# Patient Record
Sex: Female | Born: 1940 | Hispanic: No | Marital: Single | Smoking: Former smoker
Health system: Southern US, Community
[De-identification: ages and names within clinical notes are randomized; demographics above are authoritative.]

## PROBLEM LIST (undated history)

## (undated) DIAGNOSIS — I509 Heart failure, unspecified: Secondary | ICD-10-CM

## (undated) DIAGNOSIS — I10 Essential (primary) hypertension: Secondary | ICD-10-CM

## (undated) DIAGNOSIS — E119 Type 2 diabetes mellitus without complications: Secondary | ICD-10-CM

## (undated) DIAGNOSIS — E782 Mixed hyperlipidemia: Secondary | ICD-10-CM

## (undated) DIAGNOSIS — J449 Chronic obstructive pulmonary disease, unspecified: Secondary | ICD-10-CM

## (undated) DIAGNOSIS — J45909 Unspecified asthma, uncomplicated: Secondary | ICD-10-CM

## (undated) HISTORY — PX: CATARACT EXTRACTION: SUR2

## (undated) HISTORY — DX: Chronic obstructive pulmonary disease, unspecified: J44.9

## (undated) HISTORY — PX: OTHER SURGICAL HISTORY: SHX169

## (undated) HISTORY — DX: Mixed hyperlipidemia: E78.2

---

## 2015-05-18 DIAGNOSIS — J449 Chronic obstructive pulmonary disease, unspecified: Secondary | ICD-10-CM

## 2015-05-18 HISTORY — DX: Chronic obstructive pulmonary disease, unspecified: J44.9

## 2015-12-02 ENCOUNTER — Other Ambulatory Visit (HOSPITAL_COMMUNITY): Payer: Self-pay | Admitting: Emergency Medicine

## 2015-12-02 ENCOUNTER — Ambulatory Visit (HOSPITAL_COMMUNITY)
Admission: RE | Admit: 2015-12-02 | Discharge: 2015-12-02 | Disposition: A | Discharge status: Home / Self Care | Payer: Self-pay | Source: Ambulatory Visit | Attending: Emergency Medicine | Admitting: Emergency Medicine

## 2015-12-02 DIAGNOSIS — M79605 Pain in left leg: Secondary | ICD-10-CM

## 2015-12-02 DIAGNOSIS — R609 Edema, unspecified: Secondary | ICD-10-CM

## 2015-12-03 ENCOUNTER — Ambulatory Visit (HOSPITAL_COMMUNITY)
Admission: RE | Admit: 2015-12-03 | Discharge: 2015-12-03 | Disposition: A | Discharge status: Home / Self Care | Payer: Medicare Other | Source: Ambulatory Visit | Attending: Emergency Medicine | Admitting: Emergency Medicine

## 2015-12-03 DIAGNOSIS — R609 Edema, unspecified: Secondary | ICD-10-CM | POA: Insufficient documentation

## 2015-12-03 DIAGNOSIS — M79605 Pain in left leg: Secondary | ICD-10-CM | POA: Diagnosis not present

## 2015-12-20 ENCOUNTER — Inpatient Hospital Stay
Admission: EM | Admit: 2015-12-20 | Discharge: 2015-12-24 | DRG: 602 | Disposition: A | Discharge status: Home / Self Care | Payer: Medicare Other | Attending: Internal Medicine | Admitting: Internal Medicine

## 2015-12-20 ENCOUNTER — Encounter: Payer: Self-pay | Admitting: Emergency Medicine

## 2015-12-20 DIAGNOSIS — N170 Acute kidney failure with tubular necrosis: Secondary | ICD-10-CM | POA: Diagnosis present

## 2015-12-20 DIAGNOSIS — Z79899 Other long term (current) drug therapy: Secondary | ICD-10-CM | POA: Diagnosis not present

## 2015-12-20 DIAGNOSIS — E1140 Type 2 diabetes mellitus with diabetic neuropathy, unspecified: Secondary | ICD-10-CM | POA: Diagnosis present

## 2015-12-20 DIAGNOSIS — Z87891 Personal history of nicotine dependence: Secondary | ICD-10-CM

## 2015-12-20 DIAGNOSIS — L03119 Cellulitis of unspecified part of limb: Secondary | ICD-10-CM

## 2015-12-20 DIAGNOSIS — E785 Hyperlipidemia, unspecified: Secondary | ICD-10-CM | POA: Diagnosis present

## 2015-12-20 DIAGNOSIS — K219 Gastro-esophageal reflux disease without esophagitis: Secondary | ICD-10-CM | POA: Diagnosis present

## 2015-12-20 DIAGNOSIS — I2510 Atherosclerotic heart disease of native coronary artery without angina pectoris: Secondary | ICD-10-CM | POA: Diagnosis present

## 2015-12-20 DIAGNOSIS — R809 Proteinuria, unspecified: Secondary | ICD-10-CM | POA: Diagnosis present

## 2015-12-20 DIAGNOSIS — N179 Acute kidney failure, unspecified: Secondary | ICD-10-CM | POA: Diagnosis present

## 2015-12-20 DIAGNOSIS — I130 Hypertensive heart and chronic kidney disease with heart failure and stage 1 through stage 4 chronic kidney disease, or unspecified chronic kidney disease: Secondary | ICD-10-CM | POA: Diagnosis present

## 2015-12-20 DIAGNOSIS — E1122 Type 2 diabetes mellitus with diabetic chronic kidney disease: Secondary | ICD-10-CM | POA: Diagnosis present

## 2015-12-20 DIAGNOSIS — N183 Chronic kidney disease, stage 3 (moderate): Secondary | ICD-10-CM | POA: Diagnosis present

## 2015-12-20 DIAGNOSIS — J45909 Unspecified asthma, uncomplicated: Secondary | ICD-10-CM | POA: Diagnosis present

## 2015-12-20 DIAGNOSIS — Z7984 Long term (current) use of oral hypoglycemic drugs: Secondary | ICD-10-CM

## 2015-12-20 DIAGNOSIS — I5022 Chronic systolic (congestive) heart failure: Secondary | ICD-10-CM | POA: Diagnosis present

## 2015-12-20 DIAGNOSIS — L03116 Cellulitis of left lower limb: Secondary | ICD-10-CM | POA: Diagnosis present

## 2015-12-20 DIAGNOSIS — J449 Chronic obstructive pulmonary disease, unspecified: Secondary | ICD-10-CM | POA: Diagnosis present

## 2015-12-20 HISTORY — DX: Type 2 diabetes mellitus without complications: E11.9

## 2015-12-20 HISTORY — DX: Essential (primary) hypertension: I10

## 2015-12-20 HISTORY — DX: Heart failure, unspecified: I50.9

## 2015-12-20 HISTORY — DX: Unspecified asthma, uncomplicated: J45.909

## 2015-12-20 LAB — CBC WITH DIFFERENTIAL/PLATELET
BASOS ABS: 0 10*3/uL (ref 0–0.1)
BASOS PCT: 1 %
Eosinophils Absolute: 0.2 10*3/uL (ref 0–0.7)
Eosinophils Relative: 4 %
HEMATOCRIT: 32.7 % — AB (ref 35.0–47.0)
HEMOGLOBIN: 10.1 g/dL — AB (ref 12.0–16.0)
Lymphocytes Relative: 15 %
Lymphs Abs: 0.9 10*3/uL — ABNORMAL LOW (ref 1.0–3.6)
MCH: 24.6 pg — ABNORMAL LOW (ref 26.0–34.0)
MCHC: 30.8 g/dL — ABNORMAL LOW (ref 32.0–36.0)
MCV: 79.9 fL — ABNORMAL LOW (ref 80.0–100.0)
MONOS PCT: 7 %
Monocytes Absolute: 0.4 10*3/uL (ref 0.2–0.9)
NEUTROS ABS: 4.9 10*3/uL (ref 1.4–6.5)
NEUTROS PCT: 75 %
Platelets: 176 10*3/uL (ref 150–440)
RBC: 4.09 MIL/uL (ref 3.80–5.20)
RDW: 15.9 % — ABNORMAL HIGH (ref 11.5–14.5)
WBC: 6.5 10*3/uL (ref 3.6–11.0)

## 2015-12-20 LAB — COMPREHENSIVE METABOLIC PANEL
ALBUMIN: 4.1 g/dL (ref 3.5–5.0)
ALK PHOS: 84 U/L (ref 38–126)
ALT: 34 U/L (ref 14–54)
AST: 45 U/L — AB (ref 15–41)
Anion gap: 10 (ref 5–15)
BILIRUBIN TOTAL: 0.5 mg/dL (ref 0.3–1.2)
BUN: 48 mg/dL — AB (ref 6–20)
CO2: 26 mmol/L (ref 22–32)
Calcium: 9 mg/dL (ref 8.9–10.3)
Chloride: 100 mmol/L — ABNORMAL LOW (ref 101–111)
Creatinine, Ser: 3.42 mg/dL — ABNORMAL HIGH (ref 0.44–1.00)
GFR calc Af Amer: 14 mL/min — ABNORMAL LOW (ref >60)
GFR calc non Af Amer: 12 mL/min — ABNORMAL LOW (ref >60)
GLUCOSE: 93 mg/dL (ref 65–99)
Potassium: 4.7 mmol/L (ref 3.5–5.1)
Sodium: 136 mmol/L (ref 135–145)
TOTAL PROTEIN: 7.7 g/dL (ref 6.5–8.1)

## 2015-12-20 LAB — LACTIC ACID, PLASMA
LACTIC ACID, VENOUS: 2.4 mmol/L — AB (ref 0.5–2.0)
Lactic Acid, Venous: 1 mmol/L (ref 0.5–2.0)

## 2015-12-20 MED ORDER — ENOXAPARIN SODIUM 30 MG/0.3ML ~~LOC~~ SOLN
30.0000 mg | SUBCUTANEOUS | Status: DC
  Administered 2015-12-20 – 2015-12-21 (×2): 30 mg via SUBCUTANEOUS
  Filled 2015-12-20 (×2): qty 0.3

## 2015-12-20 MED ORDER — SODIUM CHLORIDE 0.9 % IV SOLN
INTRAVENOUS | Status: DC
  Administered 2015-12-20: 21:00:00 via INTRAVENOUS

## 2015-12-20 MED ORDER — DOCUSATE SODIUM 100 MG PO CAPS
100.0000 mg | ORAL_CAPSULE | Freq: Two times a day (BID) | ORAL | Status: DC
  Administered 2015-12-20 – 2015-12-24 (×5): 100 mg via ORAL
  Filled 2015-12-20 (×8): qty 1

## 2015-12-20 MED ORDER — IPRATROPIUM BROMIDE HFA 17 MCG/ACT IN AERS: 2.0000 | INHALATION_SPRAY | Freq: Two times a day (BID) | RESPIRATORY_TRACT | Status: DC

## 2015-12-20 MED ORDER — ONDANSETRON HCL 4 MG PO TABS: 4.0000 mg | ORAL_TABLET | Freq: Four times a day (QID) | ORAL | Status: DC | PRN

## 2015-12-20 MED ORDER — VANCOMYCIN HCL 500 MG IV SOLR
500.0000 mg | Freq: Once | INTRAVENOUS | Status: DC
  Filled 2015-12-20: qty 500

## 2015-12-20 MED ORDER — ASPIRIN EC 81 MG PO TBEC
81.0000 mg | DELAYED_RELEASE_TABLET | Freq: Every day | ORAL | Status: DC
  Administered 2015-12-21 – 2015-12-24 (×4): 81 mg via ORAL
  Filled 2015-12-20 (×4): qty 1

## 2015-12-20 MED ORDER — ACETAMINOPHEN 650 MG RE SUPP: 650.0000 mg | Freq: Four times a day (QID) | RECTAL | Status: DC | PRN

## 2015-12-20 MED ORDER — SODIUM CHLORIDE 0.9 % IV SOLN: INTRAVENOUS | Status: DC

## 2015-12-20 MED ORDER — VITAMIN D 1000 UNITS PO TABS
1000.0000 [IU] | ORAL_TABLET | Freq: Every day | ORAL | Status: DC
  Administered 2015-12-21 – 2015-12-24 (×4): 1000 [IU] via ORAL
  Filled 2015-12-20 (×4): qty 1

## 2015-12-20 MED ORDER — MOMETASONE FURO-FORMOTEROL FUM 200-5 MCG/ACT IN AERO
2.0000 | INHALATION_SPRAY | Freq: Two times a day (BID) | RESPIRATORY_TRACT | Status: DC
  Administered 2015-12-21 – 2015-12-24 (×7): 2 via RESPIRATORY_TRACT
  Filled 2015-12-20: qty 8.8

## 2015-12-20 MED ORDER — SODIUM CHLORIDE 0.9 % IV BOLUS (SEPSIS): 1000.0000 mL | Freq: Once | INTRAVENOUS | Status: DC

## 2015-12-20 MED ORDER — SODIUM CHLORIDE 0.9 % IV BOLUS (SEPSIS)
1000.0000 mL | Freq: Once | INTRAVENOUS | Status: AC
  Administered 2015-12-20: 1000 mL via INTRAVENOUS

## 2015-12-20 MED ORDER — MONTELUKAST SODIUM 10 MG PO TABS
10.0000 mg | ORAL_TABLET | Freq: Every day | ORAL | Status: DC
  Administered 2015-12-20 – 2015-12-23 (×4): 10 mg via ORAL
  Filled 2015-12-20 (×4): qty 1

## 2015-12-20 MED ORDER — ONDANSETRON HCL 4 MG/2ML IJ SOLN: 4.0000 mg | Freq: Four times a day (QID) | INTRAMUSCULAR | Status: DC | PRN

## 2015-12-20 MED ORDER — IPRATROPIUM BROMIDE 0.02 % IN SOLN
0.5000 mg | Freq: Two times a day (BID) | RESPIRATORY_TRACT | Status: DC
  Administered 2015-12-20 – 2015-12-22 (×5): 0.5 mg via RESPIRATORY_TRACT
  Filled 2015-12-20 (×5): qty 2.5

## 2015-12-20 MED ORDER — SODIUM CHLORIDE 0.9 % IV BOLUS (SEPSIS): 500.0000 mL | Freq: Once | INTRAVENOUS | Status: DC

## 2015-12-20 MED ORDER — CARVEDILOL 12.5 MG PO TABS
12.5000 mg | ORAL_TABLET | Freq: Two times a day (BID) | ORAL | Status: DC
  Administered 2015-12-21 – 2015-12-24 (×6): 12.5 mg via ORAL
  Filled 2015-12-20 (×7): qty 1

## 2015-12-20 MED ORDER — GABAPENTIN 300 MG PO CAPS
300.0000 mg | ORAL_CAPSULE | Freq: Three times a day (TID) | ORAL | Status: DC
  Administered 2015-12-20 – 2015-12-24 (×11): 300 mg via ORAL
  Filled 2015-12-20 (×12): qty 1

## 2015-12-20 MED ORDER — MELATONIN 3 MG PO TABS: 3.0000 mg | ORAL_TABLET | Freq: Every day | ORAL | Status: DC

## 2015-12-20 MED ORDER — FLUTICASONE PROPIONATE 50 MCG/ACT NA SUSP: 1.0000 | Freq: Every day | NASAL | Status: DC | PRN

## 2015-12-20 MED ORDER — GLIPIZIDE 5 MG PO TABS: 5.0000 mg | ORAL_TABLET | Freq: Two times a day (BID) | ORAL | Status: DC

## 2015-12-20 MED ORDER — PIPERACILLIN-TAZOBACTAM 3.375 G IVPB 30 MIN
3.3750 g | Freq: Once | INTRAVENOUS | Status: AC
  Administered 2015-12-20: 3.375 g via INTRAVENOUS
  Filled 2015-12-20: qty 50

## 2015-12-20 MED ORDER — PRAVASTATIN SODIUM 10 MG PO TABS
10.0000 mg | ORAL_TABLET | Freq: Every day | ORAL | Status: DC
  Administered 2015-12-21 – 2015-12-23 (×3): 10 mg via ORAL
  Filled 2015-12-20 (×4): qty 1

## 2015-12-20 MED ORDER — FERROUS SULFATE 325 (65 FE) MG PO TABS
325.0000 mg | ORAL_TABLET | Freq: Every day | ORAL | Status: DC
  Administered 2015-12-21 – 2015-12-24 (×4): 325 mg via ORAL
  Filled 2015-12-20 (×4): qty 1

## 2015-12-20 MED ORDER — PIPERACILLIN-TAZOBACTAM 3.375 G IVPB
3.3750 g | Freq: Two times a day (BID) | INTRAVENOUS | Status: DC
  Administered 2015-12-21: 3.375 g via INTRAVENOUS
  Filled 2015-12-20 (×2): qty 50

## 2015-12-20 MED ORDER — PIPERACILLIN-TAZOBACTAM 3.375 G IVPB 30 MIN
3.3750 g | Freq: Three times a day (TID) | INTRAVENOUS | Status: DC
  Filled 2015-12-20 (×2): qty 50

## 2015-12-20 MED ORDER — ACETAMINOPHEN 325 MG PO TABS
650.0000 mg | ORAL_TABLET | Freq: Four times a day (QID) | ORAL | Status: DC | PRN
Start: 2015-12-20 — End: 2015-12-24
  Administered 2015-12-20 – 2015-12-24 (×6): 650 mg via ORAL
  Filled 2015-12-20 (×6): qty 2

## 2015-12-20 MED ORDER — TRAMADOL HCL 50 MG PO TABS
50.0000 mg | ORAL_TABLET | Freq: Four times a day (QID) | ORAL | Status: DC | PRN
  Administered 2015-12-20: 50 mg via ORAL
  Filled 2015-12-20: qty 1

## 2015-12-20 MED ORDER — PANTOPRAZOLE SODIUM 40 MG PO TBEC
40.0000 mg | DELAYED_RELEASE_TABLET | Freq: Every day | ORAL | Status: DC
Start: 2015-12-20 — End: 2015-12-24
  Administered 2015-12-20 – 2015-12-24 (×5): 40 mg via ORAL
  Filled 2015-12-20 (×5): qty 1

## 2015-12-20 MED ORDER — VANCOMYCIN HCL IN DEXTROSE 1-5 GM/200ML-% IV SOLN
1000.0000 mg | Freq: Once | INTRAVENOUS | Status: AC
  Administered 2015-12-20: 1000 mg via INTRAVENOUS
  Filled 2015-12-20: qty 200

## 2015-12-20 NOTE — ED Notes (Signed)
States had an infection in her leg but at the doctor today he told her that she now had infection in blood stream and to go to ER. Having chills x 1 week.

## 2015-12-20 NOTE — Progress Notes (Signed)
Pharmacy Antibiotic Note  Ann KannerGracie Boone is a 75 y.o. female admitted on 12/20/2015 with cellulitis.  Pharmacy has been consulted for vancomycin & piperacillin/tazobactam dosing.  Patient has received cephalexin, clindamycin, and SMX/TMP outpatient prior to admission for cellulitis. Vancomycin and piperacillin/tazobactam being started on admission. Patient also has AKI.  Plan: Piperacillin/tazobactam 3.375 g IV q12h EI  Vancomycin 1500 mg dose tonight (~20 mg/kg adjusted body weight of 77 kg).  Will check vancomycin level at 1700 tomorrow (~24 hour level) and dose off of random level given AKI and estimated CrCl of ~17 mL/min.  Goal vancomycin trough 10-15 mcg/mL  Height: 5\' 6"  (167.6 cm) Weight: 228 lb (103.42 kg) IBW/kg (Calculated) : 59.3  Temp (24hrs), Avg:99.2 F (37.3 C), Min:99.2 F (37.3 C), Max:99.2 F (37.3 C)   Recent Labs Lab 12/20/15 1258 12/20/15 1640  WBC 6.5  --   CREATININE 3.42*  --   LATICACIDVEN  --  1.0    Estimated Creatinine Clearance: 17.8 mL/min (by C-G formula based on Cr of 3.42).    No Known Allergies  Antimicrobials this admission: vancomycin 5/22 >>  Piperacillin/tazobactam 5/22 >>   Dose adjustments this admission:  Microbiology results: 5/22 BCx: Sent 5/22 UCx: Sent   Thank you for allowing pharmacy to be a part of this patient's care.  Cindi CarbonMary M Zophia Marrone, PharmD Clinical Pharmacist 12/20/2015 5:53 PM

## 2015-12-20 NOTE — ED Provider Notes (Signed)
[facility] Emergency Department Provider Note   ____________________________________________  Time seen: Approximately 345 PM  I have reviewed the triage vital signs and the nursing notes.   HISTORY  Chief Complaint Abnormal Lab   HPI Ann Boone is a 75 y.o. female with a history of diabetes and hypertension who is presenting to the emergency department today with left lower extremity swelling and fever. She has had 3 courses of outpatient antibiotics with her primary care doctor without resolution of her cellulitis. She had a wound culture that grew Klebsiella and was found to be febrile to 100.9 earlier today at [facility]. She was being seen by Dr. Samuella CotaPrice. The patient was on Keflex and Bactrim and clindamycin with worsening of her left lower extremity cellulitis. The patient says that she also has had chills over the week. Patient has also had her Lasix doubled for period of time to 40 mg per day. Renal function has been tracked by the primary care doctor and was 1.28 about 2 weeks ago. When the patient follow-up at her primary care doctor's office earlier today she was sent to the emergency department because of her worsening clinical condition and now fever.   Past Medical History  Diagnosis Date  . Diabetes mellitus without complication (HCC)   . Hypertension   . Asthma     There are no active problems to display for this patient.   History reviewed. No pertinent past surgical history.  No current outpatient prescriptions on file.  Allergies Review of patient's allergies indicates no known allergies.  No family history on file.  Social History Social History  Substance Use Topics  . Smoking status: Former Games developermoker  . Smokeless tobacco: None  . Alcohol Use: No    Review of Systems Constitutional: chills Eyes: No visual changes. ENT: No sore throat. Cardiovascular: Denies chest pain. Respiratory: Denies  shortness of breath. Gastrointestinal: No abdominal pain.  No nausea, no vomiting.  No diarrhea.  No constipation. Genitourinary: Negative for dysuria. Musculoskeletal: Negative for back pain. Skin: Negative for rash. Neurological: Negative for headaches, focal weakness or numbness.  10-point ROS otherwise negative.  ____________________________________________   PHYSICAL EXAM:  VITAL SIGNS: ED Triage Vitals  Enc Vitals Group     BP 12/20/15 1248 155/49 mmHg     Pulse Rate 12/20/15 1248 70     Resp 12/20/15 1248 20     Temp 12/20/15 1248 99.2 F (37.3 C)     Temp src --      SpO2 12/20/15 1248 99 %     Weight 12/20/15 1248 226 lb (102.513 kg)     Height 12/20/15 1248 5\' 6"  (1.676 m)     Head Cir --      Peak Flow --      Pain Score 12/20/15 1249 8     Pain Loc --      Pain Edu? --      Excl. in GC? --     Constitutional: Alert and oriented. Well appearing and in no acute distress. Eyes: Conjunctivae are normal. PERRL. EOMI. Head: Atraumatic. Nose: No congestion/rhinnorhea. Mouth/Throat: Mucous membranes are moist.   Neck: No stridor.   Cardiovascular: Normal rate, regular rhythm. Grossly normal heart sounds.  Good peripheral circulation Intact dorsalis pedis pulses to bilateral lower extremities Respiratory: Normal respiratory effort.  No retractions. Lungs CTAB. Gastrointestinal: Soft and nontender. No distention.  Musculoskeletal: Bilateral lower extremity edema with the left being greater than the right. Erythema  to the bilateral lower extremities. On the left the erythema extends from the ankle to just below the knee. On the right it centers around the ankle. Mild tenderness to palpation to left lower extremity.  No induration.   Flaking skin to the left leg as well. Skin:  Skin is warm, dry and intact. No rash noted. Psychiatric: Mood and affect are normal. Speech and behavior are normal.  ____________________________________________   LABS (all labs ordered are  listed, but only abnormal results are displayed)  Labs Reviewed  CBC WITH DIFFERENTIAL/PLATELET - Abnormal; Notable for the following:    Hemoglobin 10.1 (*)    HCT 32.7 (*)    MCV 79.9 (*)    MCH 24.6 (*)    MCHC 30.8 (*)    RDW 15.9 (*)    Lymphs Abs 0.9 (*)    All other components within normal limits  COMPREHENSIVE METABOLIC PANEL - Abnormal; Notable for the following:    Chloride 100 (*)    BUN 48 (*)    Creatinine, Ser 3.42 (*)    AST 45 (*)    GFR calc non Af Amer 12 (*)    GFR calc Af Amer 14 (*)    All other components within normal limits  CULTURE, BLOOD (ROUTINE X 2)  CULTURE, BLOOD (ROUTINE X 2)  URINE CULTURE  URINALYSIS COMPLETEWITH MICROSCOPIC ([facility] ONLY)  LACTIC ACID, PLASMA  LACTIC ACID, PLASMA   ____________________________________________  EKG   ____________________________________________  RADIOLOGY   ____________________________________________   PROCEDURES   ____________________________________________   INITIAL IMPRESSION / ASSESSMENT AND PLAN / ED COURSE  Pertinent labs & imaging results that were available during my care of the patient were reviewed by me and considered in my medical decision making (see chart for details).  ----------------------------------------- 4:22 PM on 12/20/2015 -----------------------------------------  I discussed the case with Dr. Samuella Cota, the patient's primary care doctor. Says that the patient has also had a left lower extremity DVT ultrasound which was negative. Sepsis lower: This patient who has had a failure of multiple antibiotics as an outpatient. Afebrile here in the emergency department. Did not receive Tylenol as an outpatient earlier today. Informed the patient as well as her son the need for admission for acute renal failure as well as failure of outpatient treatment. They're understanding of the plan and willing to comply. Signed out to Dr. Amado Coe.    ____________________________________________   FINAL CLINICAL IMPRESSION(S) / ED DIAGNOSES  Bilateral lower extremity cellulitis with failure of outpatient treatment. Acute renal failure.    NEW MEDICATIONS STARTED DURING THIS VISIT:  New Prescriptions   No medications on file     Note:  This document was prepared using Dragon voice recognition software and may include unintentional dictation errors.    Myrna Blazer, MD 12/20/15 [phone]

## 2015-12-20 NOTE — ED Notes (Signed)
CODE  SEPSIS  CALLED  TO  CARELINK 

## 2015-12-20 NOTE — ED Notes (Signed)
Pt ambulated to toilet without difficulty. Pt attempted to collect urine sample in urine collector placed in toilet but voided into toilet. Pt instructed to notify nurse when she needs to void again.

## 2015-12-20 NOTE — H&P (Signed)
[facility] [place] [facility]   PATIENT NAME: Ann KannerGracie Boone    MR#:  [id number]  DATE OF BIRTH:  09/19/1940  DATE OF ADMISSION:  12/20/2015  PRIMARY CARE PHYSICIAN: No primary care provider on file.   REQUESTING/REFERRING PHYSICIAN: Engineer, maintenance (IT)chaevitz, MD  CHIEF COMPLAINT:  Abnormal lab  HISTORY OF PRESENT ILLNESS:  Ann KannerGracie Brocks  is a 75 y.o. female with a known history of Diabetic mellitus, hypertension and lower extremity edema is presenting to the ED with an abnormal lab. Patient was seen by her primary care physician for left lower extremity swelling and pain associated with redness and she was given 3 courses of outpatient antibiotics including Keflex, Bactrim and clindamycin. Her silhouette is has not improved and patient became febrile. Patient had a wound culture which is positive for Klebsiella. Patient's renal function was at 1.28 creatinine to about 2 weeks ago which is worse today during her follow-up visit. Patient also takes Lasix for congestive heart failure and it was doubled recently. Today patient's BUN at 48 and creatinine at 3.42.  PAST MEDICAL HISTORY:   Past Medical History  Diagnosis Date  . Diabetes mellitus without complication (HCC)   . Hypertension   . Asthma     PAST SURGICAL HISTOIRY:  History reviewed. No pertinent past surgical history.  SOCIAL HISTORY:   Social History  Substance Use Topics  . Smoking status: Former Games developermoker  . Smokeless tobacco: Not on file  . Alcohol Use: No    FAMILY HISTORY:  No family history on file.  DRUG ALLERGIES:  No Known Allergies  REVIEW OF SYSTEMS:  CONSTITUTIONAL: Reporting fever, denies fatigue or weakness.  EYES: No blurred or double vision.  EARS, NOSE, AND THROAT: No tinnitus or ear pain.  RESPIRATORY: No cough, shortness of breath, wheezing or hemoptysis.  CARDIOVASCULAR: No chest pain, orthopnea, edema.  GASTROINTESTINAL: No nausea, vomiting, diarrhea or abdominal pain.   GENITOURINARY: No dysuria, hematuria.  ENDOCRINE: No polyuria, nocturia,  HEMATOLOGY: No anemia, easy bruising or bleeding SKIN: Left leg swelling, redness No rash or lesion. MUSCULOSKELETAL: No joint pain or arthritis.   NEUROLOGIC: No tingling, numbness, weakness.  PSYCHIATRY: No anxiety or depression.   MEDICATIONS AT HOME:   Prior to Admission medications   Medication Sig Start Date End Date Taking? Authorizing Provider  allopurinol (ZYLOPRIM) 300 MG tablet Take 300 mg by mouth daily.   Yes Historical Provider, MD  aspirin EC 81 MG tablet Take 81 mg by mouth daily.   Yes Historical Provider, MD  benazepril (LOTENSIN) 20 MG tablet Take 20 mg by mouth daily.   Yes Historical Provider, MD  carvedilol (COREG) 12.5 MG tablet Take 12.5 mg by mouth 2 (two) times daily with a meal.   Yes Historical Provider, MD  cholecalciferol (VITAMIN D) 1000 units tablet Take 1,000 Units by mouth daily.   Yes Historical Provider, MD  clindamycin (CLEOCIN) 300 MG capsule Take 300 mg by mouth 3 (three) times daily. 12/17/15 12/27/15 Yes Historical Provider, MD  ferrous sulfate 325 (65 FE) MG tablet Take 325 mg by mouth daily with breakfast.   Yes Historical Provider, MD  fluticasone (FLONASE) 50 MCG/ACT nasal spray Place 1-2 sprays into both nostrils daily as needed for rhinitis.   Yes Historical Provider, MD  Fluticasone-Salmeterol (ADVAIR) 250-50 MCG/DOSE AEPB Inhale 1 puff into the lungs 2 (two) times daily.   Yes Historical Provider, MD  furosemide (LASIX) 40 MG tablet Take 40 mg by mouth 2 (two) times daily.  Yes Historical Provider, MD  gabapentin (NEURONTIN) 300 MG capsule Take 300 mg by mouth 3 (three) times daily.   Yes Historical Provider, MD  glipiZIDE (GLUCOTROL) 5 MG tablet Take 5 mg by mouth 2 (two) times daily before a meal.   Yes Historical Provider, MD  ipratropium (ATROVENT HFA) 17 MCG/ACT inhaler Inhale 2 puffs into the lungs 2 (two) times daily.   Yes Historical Provider, MD  lovastatin  (MEVACOR) 40 MG tablet Take 40 mg by mouth at bedtime.   Yes Historical Provider, MD  Melatonin 3 MG TABS Take 3 mg by mouth at bedtime.   Yes Historical Provider, MD  montelukast (SINGULAIR) 10 MG tablet Take 10 mg by mouth at bedtime.   Yes Historical Provider, MD  omeprazole (PRILOSEC) 20 MG capsule Take 20 mg by mouth daily.   Yes Historical Provider, MD  potassium chloride (K-DUR,KLOR-CON) 10 MEQ tablet Take 10 mEq by mouth daily.   Yes Historical Provider, MD  traMADol (ULTRAM) 50 MG tablet Take 50 mg by mouth every 6 (six) hours as needed for moderate pain.   Yes Historical Provider, MD      VITAL SIGNS:  Blood pressure 155/49, pulse 70, temperature 99.2 F (37.3 C), resp. rate 20, height  (1.676 m), weight 103.42 kg (228 lb), SpO2 99 %.  PHYSICAL EXAMINATION:  GENERAL:  75 y.o.-year-old patient lying in the bed with no acute distress.  EYES: Pupils equal, round, reactive to light and accommodation. No scleral icterus. Extraocular muscles intact.  HEENT: Head atraumatic, normocephalic. Oropharynx and nasopharynx clear.  NECK:  Supple, no jugular venous distention. No thyroid enlargement, no tenderness.  LUNGS: Normal breath sounds bilaterally, no wheezing, rales,rhonchi or crepitation. No use of accessory muscles of respiration.  CARDIOVASCULAR: S1, S2 normal. No murmurs, rubs, or gallops.  ABDOMEN: Soft, nontender, nondistended. Bowel sounds present. No organomegaly or mass.  EXTREMITIES: Left lower extent is edematous, tender and erythematous from below knee to ankle area. No open wounds or discharge  No pedal edema, cyanosis, or clubbing.  NEUROLOGIC: Cranial nerves II through XII are intact. Muscle strength 5/5 in all extremities. Sensation intact. Gait not checked.  PSYCHIATRIC: The patient is alert and oriented x 3.  SKIN: No obvious rash, lesion, or ulcer.   LABORATORY PANEL:   CBC  Recent Labs Lab 12/20/15 1258  WBC 6.5  HGB 10.1*  HCT 32.7*  PLT 176    ------------------------------------------------------------------------------------------------------------------  Chemistries   Recent Labs Lab 12/20/15 1258  NA 136  K 4.7  CL 100*  CO2 26  GLUCOSE 93  BUN 48*  CREATININE 3.42*  CALCIUM 9.0  AST 45*  ALT 34  ALKPHOS 84  BILITOT 0.5   ------------------------------------------------------------------------------------------------------------------  Cardiac Enzymes No results for input(s): TROPONINI in the last 168 hours. ------------------------------------------------------------------------------------------------------------------  RADIOLOGY:  No results found.  EKG:  No orders found for this or any previous visit.  IMPRESSION AND PLAN:   Ann Boone  is a 75 y.o. female with a known history of Diabetic mellitus, hypertension and lower extremity edema is presenting to the ED with an abnormal lab. Patient was seen by her primary care physician for left lower extremity swelling and pain associated with redness and she was given 3 courses of outpatient antibiotics including Keflex, Bactrim and clindamycin. Her silhouette is has not improved and patient became febrile. Patient had a wound culture which is positive for Klebsiella. Patient's renal function was at 1.28 creatinine to about 2 weeks ago which is worse today during  her follow-up visit. Patient also takes Lasix for congestive heart failure and it was doubled recently. Today patient's BUN at 48 and creatinine at 3.42.  # AKI probably ATN from medication  Provide hydration with IV fluids Stop nephrotoxins including Lasix and ACE inhibitor Will obtain renal ultrasound Monitor renal function closely Monitor intake and output and nephrology consult is placed  #Left lower extremity cellulitis acute on chronic Failed outpatient antibiotics including Keflex, Bactrim and clindamycin We will start the patient on IV Zosyn and vancomycin  DVT ruled out on May 5  with negative venous Dopplers   #History of congestive heart failure Currently patient is not fluid overloaded Will hold off on the Lasix in view of AK I Monitor daily weights, intake and output   #History of diabetes mellitus Start patient on sliding scale insulin and diabetic diet and hold by mouth home medications Continue Neurontin for diabetic neuropathy  #History of coronary artery disease Currently patient is asymptomatic. Continue home medications aspirin, Coreg and statin    Provide GI and DVT prophylaxis  All the records are reviewed and case discussed with ED provider. Management plans discussed with the patient, family and they are in agreement.  CODE STATUS: fc/ son is the HCPOA  TOTAL TIME TAKING CARE OF THIS PATIENT: 45  minutes.    Ramonita Lab M.D on 12/20/2015 at 5:07 PM  Between 7am to 6pm - Pager - [phone]  After 6pm go to [email or web address] - password [email or web address] [facility]  [place]  [facility]  Office  [phone]  CC: Primary care physician; No primary care provider on file.

## 2015-12-21 ENCOUNTER — Inpatient Hospital Stay: Payer: Medicare Other

## 2015-12-21 LAB — CBC
HEMATOCRIT: 30.3 % — AB (ref 35.0–47.0)
Hemoglobin: 9.4 g/dL — ABNORMAL LOW (ref 12.0–16.0)
MCH: 25.1 pg — AB (ref 26.0–34.0)
MCHC: 31.1 g/dL — AB (ref 32.0–36.0)
MCV: 80.7 fL (ref 80.0–100.0)
Platelets: 138 10*3/uL — ABNORMAL LOW (ref 150–440)
RBC: 3.75 MIL/uL — ABNORMAL LOW (ref 3.80–5.20)
RDW: 15.8 % — ABNORMAL HIGH (ref 11.5–14.5)
WBC: 6.1 10*3/uL (ref 3.6–11.0)

## 2015-12-21 LAB — GLUCOSE, CAPILLARY
GLUCOSE-CAPILLARY: 109 mg/dL — AB (ref 65–99)
GLUCOSE-CAPILLARY: 128 mg/dL — AB (ref 65–99)
GLUCOSE-CAPILLARY: 132 mg/dL — AB (ref 65–99)

## 2015-12-21 LAB — VANCOMYCIN, RANDOM: Vancomycin Rm: 6 ug/mL

## 2015-12-21 LAB — URINALYSIS COMPLETE WITH MICROSCOPIC (ARMC ONLY)
BACTERIA UA: NONE SEEN
BILIRUBIN URINE: NEGATIVE
GLUCOSE, UA: NEGATIVE mg/dL
Ketones, ur: NEGATIVE mg/dL
Leukocytes, UA: NEGATIVE
Nitrite: NEGATIVE
PH: 5 (ref 5.0–8.0)
Protein, ur: 30 mg/dL — AB
RBC / HPF: NONE SEEN RBC/hpf (ref 0–5)
Specific Gravity, Urine: 1.014 (ref 1.005–1.030)

## 2015-12-21 LAB — PROTEIN / CREATININE RATIO, URINE
Creatinine, Urine: 132 mg/dL
Protein Creatinine Ratio: 0.49 mg/mg{Cre} — ABNORMAL HIGH (ref 0.00–0.15)
Total Protein, Urine: 65 mg/dL

## 2015-12-21 LAB — BASIC METABOLIC PANEL
ANION GAP: 7 (ref 5–15)
BUN: 43 mg/dL — ABNORMAL HIGH (ref 6–20)
CO2: 24 mmol/L (ref 22–32)
Calcium: 8.5 mg/dL — ABNORMAL LOW (ref 8.9–10.3)
Chloride: 107 mmol/L (ref 101–111)
Creatinine, Ser: 3.16 mg/dL — ABNORMAL HIGH (ref 0.44–1.00)
GFR calc Af Amer: 16 mL/min — ABNORMAL LOW (ref >60)
GFR calc non Af Amer: 14 mL/min — ABNORMAL LOW (ref >60)
GLUCOSE: 105 mg/dL — AB (ref 65–99)
POTASSIUM: 4.4 mmol/L (ref 3.5–5.1)
Sodium: 138 mmol/L (ref 135–145)

## 2015-12-21 MED ORDER — INSULIN ASPART 100 UNIT/ML ~~LOC~~ SOLN
0.0000 [IU] | Freq: Three times a day (TID) | SUBCUTANEOUS | Status: DC
  Administered 2015-12-22 (×2): 2 [IU] via SUBCUTANEOUS
  Administered 2015-12-23: 1 [IU] via SUBCUTANEOUS
  Administered 2015-12-23: 2 [IU] via SUBCUTANEOUS
  Administered 2015-12-23: 1 [IU] via SUBCUTANEOUS
  Filled 2015-12-21 (×2): qty 2
  Filled 2015-12-21 (×2): qty 1
  Filled 2015-12-21: qty 2

## 2015-12-21 MED ORDER — SODIUM CHLORIDE 0.9 % IV SOLN
INTRAVENOUS | Status: DC
  Administered 2015-12-21 – 2015-12-24 (×5): via INTRAVENOUS

## 2015-12-21 MED ORDER — INSULIN ASPART 100 UNIT/ML ~~LOC~~ SOLN: 0.0000 [IU] | Freq: Every day | SUBCUTANEOUS | Status: DC

## 2015-12-21 MED ORDER — PIPERACILLIN-TAZOBACTAM 3.375 G IVPB
3.3750 g | Freq: Three times a day (TID) | INTRAVENOUS | Status: DC
  Administered 2015-12-21 – 2015-12-22 (×3): 3.375 g via INTRAVENOUS
  Filled 2015-12-21 (×5): qty 50

## 2015-12-21 NOTE — Care Management (Signed)
Patient failed outpatient attempts to resolve her sx of cellulitis on lower extremities.  She has had a significant increase in her BUN/Creat since she had her lasix increased.  She lives alone and independent in all her adls.  Does not drive- uses [employer] for transportation.  Patient does not make eye contact with CM.  She denies visual / hearing problems.  Her grandson that is present confirms.  Denies issues accessing medical care.  would be agreeable to home health nursing if attending feels patient would benefit.  She is followed by Maryan Ruedrystal Pike at [facility] in [place]

## 2015-12-21 NOTE — Consult Note (Signed)
[facility] CONSULT NOTE    Date: 12/21/2015                  Patient Name:  Ann Boone  MRN: [id number]  DOB: 05/05/1941  Age / Sex: 75 y.o., female         PCP: No primary care provider on file.                 Service Requesting Consult: Dr. Amado CoeGouru                 Reason for Consult: Acute renal failure            History of Present Illness: Patient is a 75 y.o. female with a PMHx of Diabetes mellitus type 2, hypertension, asthma, who was admitted to [facility] on 12/20/2015 for evaluation of abnormal labs noted by her primary care physician. The patient apparently recently had left lower extremity swelling and cellulitis. This was apparently treated with Keflex, Bactrim, and clindamycin. She had a wound culture which was apparently positive for Klebsiella. Her baseline creatinine appears to be 1.2, however upon presentation here creatinine was up to 3.4. Today creatinine is down to 3.1. Patient denies ingestion of NSAIDs. She also denies nausea, vomiting, or diarrhea. She was noted as being on benazepril at home as well. Patient was found to be febrile upon presentation at 100.8. Renal ultrasound was just performed however result is not yet available.   Medications: Outpatient medications: Prescriptions prior to admission  Medication Sig Dispense Refill Last Dose  . allopurinol (ZYLOPRIM) 300 MG tablet Take 300 mg by mouth daily.   unknown at unknown   . aspirin EC 81 MG tablet Take 81 mg by mouth daily.   unknown at unknown   . benazepril (LOTENSIN) 20 MG tablet Take 20 mg by mouth daily.   unknown at unknown  . carvedilol (COREG) 12.5 MG tablet Take 12.5 mg by mouth 2 (two) times daily with a meal.   unknown at unknown   . cholecalciferol (VITAMIN D) 1000 units tablet Take 1,000 Units by mouth daily.   unknown at unknown   . clindamycin (CLEOCIN) 300 MG capsule Take 300 mg by mouth 3 (three) times daily.   unknown at unknown   . ferrous sulfate 325 (65 FE) MG  tablet Take 325 mg by mouth daily with breakfast.   unknown at unknown   . fluticasone (FLONASE) 50 MCG/ACT nasal spray Place 1-2 sprays into both nostrils daily as needed for rhinitis.   PRN at PRN  . Fluticasone-Salmeterol (ADVAIR) 250-50 MCG/DOSE AEPB Inhale 1 puff into the lungs 2 (two) times daily.   unknown at unknown   . furosemide (LASIX) 40 MG tablet Take 40 mg by mouth 2 (two) times daily.   unknown at unknown   . gabapentin (NEURONTIN) 300 MG capsule Take 300 mg by mouth 3 (three) times daily.   unknown at unknown   . glipiZIDE (GLUCOTROL) 5 MG tablet Take 5 mg by mouth 2 (two) times daily before a meal.   unknown at unknown   . ipratropium (ATROVENT HFA) 17 MCG/ACT inhaler Inhale 2 puffs into the lungs 2 (two) times daily.   unknown at unknown   . lovastatin (MEVACOR) 40 MG tablet Take 40 mg by mouth at bedtime.   unknown at unknown   . Melatonin 3 MG TABS Take 3 mg by mouth at bedtime.   unknown at unknown   . montelukast (SINGULAIR) 10 MG tablet Take  10 mg by mouth at bedtime.   unknown at unknown   . omeprazole (PRILOSEC) 20 MG capsule Take 20 mg by mouth daily.   unknown at unknown   . potassium chloride (K-DUR,KLOR-CON) 10 MEQ tablet Take 10 mEq by mouth daily.   unknown at unknown   . traMADol (ULTRAM) 50 MG tablet Take 50 mg by mouth every 6 (six) hours as needed for moderate pain.   PRN at PRN    Current medications: Current Facility-Administered Medications  Medication Dose Route Frequency Provider Last Rate Last Dose  . acetaminophen (TYLENOL) tablet 650 mg  650 mg Oral Q6H PRN Ramonita Lab, MD   650 mg at 12/21/15 0446   Or  . acetaminophen (TYLENOL) suppository 650 mg  650 mg Rectal Q6H PRN Ramonita Lab, MD      . aspirin EC tablet 81 mg  81 mg Oral Daily Ramonita Lab, MD   81 mg at 12/21/15 0937  . carvedilol (COREG) tablet 12.5 mg  12.5 mg Oral BID WC Ramonita Lab, MD   12.5 mg at 12/21/15 0817  . cholecalciferol (VITAMIN D) tablet 1,000 Units  1,000 Units Oral Daily  Ramonita Lab, MD   1,000 Units at 12/21/15 0937  . docusate sodium (COLACE) capsule 100 mg  100 mg Oral BID Ramonita Lab, MD   100 mg at 12/21/15 0937  . enoxaparin (LOVENOX) injection 30 mg  30 mg Subcutaneous Q24H Ramonita Lab, MD   30 mg at 12/20/15 2041  . ferrous sulfate tablet 325 mg  325 mg Oral Q breakfast Ramonita Lab, MD   325 mg at 12/21/15 0801  . fluticasone (FLONASE) 50 MCG/ACT nasal spray 1-2 spray  1-2 spray Each Nare Daily PRN Ramonita Lab, MD      . gabapentin (NEURONTIN) capsule 300 mg  300 mg Oral TID Ramonita Lab, MD   300 mg at 12/21/15 0937  . glipiZIDE (GLUCOTROL) tablet 5 mg  5 mg Oral BID AC Ramonita Lab, MD   5 mg at 12/21/15 0804  . ipratropium (ATROVENT) nebulizer solution 0.5 mg  0.5 mg Nebulization BID Ramonita Lab, MD   0.5 mg at 12/21/15 0740  . mometasone-formoterol (DULERA) 200-5 MCG/ACT inhaler 2 puff  2 puff Inhalation BID Ramonita Lab, MD   2 puff at 12/21/15 0801  . montelukast (SINGULAIR) tablet 10 mg  10 mg Oral QHS Ramonita Lab, MD   10 mg at 12/20/15 2040  . ondansetron (ZOFRAN) tablet 4 mg  4 mg Oral Q6H PRN Ramonita Lab, MD       Or  . ondansetron (ZOFRAN) injection 4 mg  4 mg Intravenous Q6H PRN Aruna Gouru, MD      . pantoprazole (PROTONIX) EC tablet 40 mg  40 mg Oral Daily Ramonita Lab, MD   40 mg at 12/21/15 0937  . piperacillin-tazobactam (ZOSYN) IVPB 3.375 g  3.375 g Intravenous Q8H Houston Siren, MD      . pravastatin (PRAVACHOL) tablet 10 mg  10 mg Oral q1800 Ramonita Lab, MD      . traMADol (ULTRAM) tablet 50 mg  50 mg Oral Q6H PRN Ramonita Lab, MD   50 mg at 12/20/15 2041  . vancomycin (VANCOCIN) 500 mg in sodium chloride 0.9 % 100 mL IVPB  500 mg Intravenous Once Cindi Carbon, RPH          Allergies: No Known Allergies    Past Medical History: Past Medical History  Diagnosis Date  . Diabetes mellitus without complication (HCC)   .  Hypertension   . Asthma   . CHF (congestive heart failure) (HCC)      Past Surgical History: History  reviewed. No pertinent past surgical history.   Family History: Denies family history of ESRD.    Social History: Social History   Social History  . Marital Status: Unknown    Spouse Name: N/A  . Number of Children: N/A  . Years of Education: N/A   Occupational History  . Not on file.   Social History Main Topics  . Smoking status: Former Games developer  . Smokeless tobacco: Not on file  . Alcohol Use: No  . Drug Use: No  . Sexual Activity: Not on file   Other Topics Concern  . Not on file   Social History Narrative  . No narrative on file     Review of Systems: Review of Systems  Constitutional: Positive for fever and malaise/fatigue. Negative for weight loss.  HENT: Negative for hearing loss and nosebleeds.   Eyes: Negative for blurred vision, double vision and photophobia.  Respiratory: Negative for cough, hemoptysis and sputum production.   Cardiovascular: Negative for chest pain, palpitations and orthopnea.  Gastrointestinal: Negative for heartburn, nausea, vomiting and diarrhea.  Genitourinary: Negative for dysuria, urgency and frequency.  Musculoskeletal: Negative for myalgias.  Skin: Negative for itching and rash.  Neurological: Negative for dizziness and focal weakness.  Endo/Heme/Allergies: Negative for polydipsia. Does not bruise/bleed easily.  Psychiatric/Behavioral: Negative for depression. The patient is not nervous/anxious.      Vital Signs: Blood pressure 106/54, pulse 65, temperature 99.4 F (37.4 C), temperature source Oral, resp. rate 18, height  (1.676 m), weight 105.507 kg (232 lb 9.6 oz), SpO2 94 %.  Weight trends: Filed Weights   12/20/15 1647 12/20/15 1930 12/21/15 0440  Weight: 103.42 kg (228 lb) 101.56 kg (223 lb 14.4 oz) 105.507 kg (232 lb 9.6 oz)    Physical Exam: General: NAD, laying in bed  Head: Normocephalic, atraumatic.  Eyes: Anicteric, EOMI  Nose: Mucous membranes moist, not inflammed, nonerythematous.  Throat:  Oropharynx nonerythematous, no exudate appreciated.   Neck: Supple, trachea midline.  Lungs:  Normal respiratory effort. Clear to auscultation BL without crackles or wheezes.  Heart: RRR. S1 and S2 normal without gallop, murmur, or rubs.  Abdomen:  BS normoactive. Soft, Nondistended, non-tender.  No masses or organomegaly.  Extremities: Left lower extremity edema noted, mild erythema in LLE  Neurologic: A&O X3, Motor strength is 5/5 in the all 4 extremities  Skin: Good turgor, warm/dry    Lab results: Basic Metabolic Panel:  Recent Labs Lab 12/20/15 1258 12/21/15 0611  NA 136 138  K 4.7 4.4  CL 100* 107  CO2 26 24  GLUCOSE 93 105*  BUN 48* 43*  CREATININE 3.42* 3.16*  CALCIUM 9.0 8.5*    Liver Function Tests:  Recent Labs Lab 12/20/15 1258  AST 45*  ALT 34  ALKPHOS 84  BILITOT 0.5  PROT 7.7  ALBUMIN 4.1   No results for input(s): LIPASE, AMYLASE in the last 168 hours. No results for input(s): AMMONIA in the last 168 hours.  CBC:  Recent Labs Lab 12/20/15 1258 12/21/15 0611  WBC 6.5 6.1  NEUTROABS 4.9  --   HGB 10.1* 9.4*  HCT 32.7* 30.3*  MCV 79.9* 80.7  PLT 176 138*    Cardiac Enzymes: No results for input(s): CKTOTAL, CKMB, CKMBINDEX, TROPONINI in the last 168 hours.  BNP: Invalid input(s): POCBNP  CBG:  Recent Labs Lab 12/21/15 0735  GLUCAP  109*    Microbiology: No results found for this or any previous visit.  Coagulation Studies: No results for input(s): LABPROT, INR in the last 72 hours.  Urinalysis: No results for input(s): COLORURINE, LABSPEC, PHURINE, GLUCOSEU, HGBUR, BILIRUBINUR, KETONESUR, PROTEINUR, UROBILINOGEN, NITRITE, LEUKOCYTESUR in the last 72 hours.  Invalid input(s): APPERANCEUR    Imaging:  No results found.   Assessment & Plan: Pt is a 75 y.o. female with a PMHx of Diabetes mellitus type 2, hypertension, asthma, who was admitted to [facility] on 12/20/2015 for evaluation of abnormal labs noted by her primary care  physician. The patient apparently recently had left lower extremity swelling and cellulitis. This was apparently treated with Keflex, Bactrim, and clindamycin.   1.  Acute renal failure/proteinuria:  Baseline Cr 1.2.  Acute renal failure likely related to infection as well as medications with contributions from Bactrim and been as appropriate. Renal ultrasound is pending. We will start the patient on IV fluid hydration with 0.9 normal saline at 75 cc per hour. Continue to monitor renal function daily. Check SPEP, UPEP, ANA for additional workup. Avoid nephrotoxins and IV contrast at this time.  2.  Hypertension: Continue Coreg. Agree with holding benazepril at this time. Continue to monitor blood pressure.  3.  Thanks for consultation.

## 2015-12-21 NOTE — Evaluation (Signed)
Physical Therapy Evaluation Patient Details Name: Ann Boone MRN: [id number] DOB: 09/03/1940 Today's Date: 12/21/2015   History of Present Illness  Pt is a 75 y.o. female presenting with abnormal labs and L LE swelling, pain, and redness.  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  PMH includes DM, htn, asthma, and h/o CHF.  Clinical Impression  Prior to admission, pt was independent ambulating without AD.  Pt lives alone on ground floor apt.  Currently pt is SBA supine to sit, CGA to stand, and CGA to min assist to ambulate 150 feet with RW (mild antalgic gait d/t L LE pain and pt caught L foot on floor 1x but able to self correct with use of RW).  Pt would benefit from skilled PT to address noted impairments and functional limitations.  Recommend pt discharge to home with HHPT when medically appropriate.     Follow Up Recommendations Home health PT    Equipment Recommendations  Rolling walker with 5" wheels    Recommendations for Other Services       Precautions / Restrictions Precautions Precautions: Fall Restrictions Weight Bearing Restrictions: No      Mobility  Bed Mobility Overal bed mobility: Needs Assistance Bed Mobility: Supine to Sit     Supine to sit: Supervision;HOB elevated        Transfers Overall transfer level: Needs assistance Equipment used: Rolling walker (2 wheeled) Transfers: Sit to/from Stand Sit to Stand: Min guard         General transfer comment: steady without loss of balance  Ambulation/Gait Ambulation/Gait assistance: Min guard;Min assist Ambulation Distance (Feet): 150 Feet Assistive device: Rolling walker (2 wheeled)   Gait velocity: decreased   General Gait Details: antalgic gait; decreased stance time L LE; increased L lateral lean initially during L stance phase but with vc's decreased significantly; pt caught L foot on floor 1x and tripped but pt able to self correct with use of RW  Stairs             Wheelchair Mobility    Modified Rankin (Stroke Patients Only)       Balance Overall balance assessment: Needs assistance Sitting-balance support: Bilateral upper extremity supported;Feet supported Sitting balance-Leahy Scale: Good     Standing balance support: Bilateral upper extremity supported (on RW) Standing balance-Leahy Scale: Good                               Pertinent Vitals/Pain Pain Assessment: 0-10 Pain Score: 3  Pain Location: L LE Pain Descriptors / Indicators: Sore;Tender Pain Intervention(s): Limited activity within patient's tolerance;Monitored during session;Repositioned  Vitals stable and WFL throughout treatment session.    Home Living Family/patient expects to be discharged to:: Private residence Living Arrangements: Alone Available Help at Discharge: Family Type of Home: Apartment Home Access: Level entry (1st floor apt)     Home Layout: One level Home Equipment: None      Prior Function Level of Independence: Independent         Comments: Pt denies any falls in past 6 months.     Hand Dominance        Extremity/Trunk Assessment   Upper Extremity Assessment: Generalized weakness           Lower Extremity Assessment: Generalized weakness         Communication   Communication: No difficulties  Cognition Arousal/Alertness: Awake/alert Behavior During Therapy: WFL for tasks assessed/performed Overall  Cognitive Status: Within Functional Limits for tasks assessed                      General Comments   Nursing cleared pt for participation in physical therapy.  Pt agreeable to PT session.    Exercises Total Joint Exercises Ankle Circles/Pumps: AROM;Strengthening;Both;10 reps;Supine Quad Sets: AROM;Strengthening;Both;10 reps;Supine Short Arc Quad: AROM;Strengthening;Both;10 reps;Supine Heel Slides: AROM;Strengthening;Both;10 reps;Supine Hip ABduction/ADduction: AROM;Strengthening;Both;10  reps;Supine      Assessment/Plan    PT Assessment Patient needs continued PT services  PT Diagnosis Difficulty walking;Generalized weakness   PT Problem List Decreased strength;Decreased activity tolerance;Decreased balance;Decreased mobility;Decreased knowledge of use of DME;Decreased knowledge of precautions;Pain  PT Treatment Interventions DME instruction;Gait training;Functional mobility training;Therapeutic exercise;Therapeutic activities;Balance training;Patient/family education   PT Goals (Current goals can be found in the Care Plan section) Acute Rehab PT Goals Patient Stated Goal: to get OOB and walk PT Goal Formulation: With patient Time For Goal Achievement: 01/04/16 Potential to Achieve Goals: Good    Frequency Min 2X/week   Barriers to discharge Decreased caregiver support      Co-evaluation               End of Session Equipment Utilized During Treatment: Gait belt Activity Tolerance: Patient limited by fatigue Patient left: in chair;with call bell/phone within reach;with chair alarm set;with family/visitor present Nurse Communication: Mobility status;Precautions         Time: 4098-1191 PT Time Calculation (min) (ACUTE ONLY): 29 min   Charges:   PT Evaluation $PT Eval Moderate Complexity: 1 Procedure PT Treatments $Therapeutic Exercise: 8-22 mins   PT G CodesHendricks Limes 12-26-2015, 4:45 PM Hendricks Limes, PT [phone]

## 2015-12-21 NOTE — Clinical Documentation Improvement (Signed)
Internal Medicine  Can the diagnosis of CHF be further specified?    Acuity - Acute, Chronic, Acute on Chronic   Type - Systolic, Diastolic, Systolic and Diastolic  Other  Clinically Undetermined  Document any associated diagnoses/conditions Please update your documentation within the medical record to reflect your response to this query. Thank you.  Supporting Information:(As per notes) "Patient also takes Lasix for congestive heart failure and it was doubled recently"  Please exercise your independent, professional judgment when responding. A specific answer is not anticipated or expected.  Thank You, Estelene Carmack B ThompNevin Bloodgoodson, RN, BSN, CCDS,Clinical Documentation Specialist:  [phone]  [phone]=Cell [place]- Health Information Management

## 2015-12-21 NOTE — Progress Notes (Signed)
[employer] [facility] [place] [facility]   PATIENT NAME: Ann KannerGracie Boone    MR#:  [id number]  DATE OF BIRTH:  12/29/1940  SUBJECTIVE:   Patient here due to acute kidney injury and also noted to have right lower extreme cellulitis. Feels better since yesterday. Still has some redness and pain to the right lower extremity.  REVIEW OF SYSTEMS:    Review of Systems  Constitutional: Negative for fever and chills.  HENT: Negative for congestion and tinnitus.   Eyes: Negative for blurred vision and double vision.  Respiratory: Negative for cough, shortness of breath and wheezing.   Cardiovascular: Negative for chest pain, orthopnea and PND.  Gastrointestinal: Negative for nausea, vomiting, abdominal pain and diarrhea.  Genitourinary: Negative for dysuria and hematuria.  Skin: Positive for rash (right lower extremity cellulitis).  Neurological: Positive for weakness (generalized). Negative for dizziness, sensory change and focal weakness.  All other systems reviewed and are negative.   Nutrition: Heart Health/Carb modified Tolerating Diet: yes Tolerating PT: await eval.   DRUG ALLERGIES:  No Known Allergies  VITALS:  Blood pressure 120/58, pulse 74, temperature 98.3 F (36.8 C), temperature source Oral, resp. rate 17, height 5\' 6"  (1.676 m), weight 105.507 kg (232 lb 9.6 oz), SpO2 99 %.  PHYSICAL EXAMINATION:   Physical Exam  GENERAL:  [age] y.o.-year-old patient lying in the bed in no acute distress.  EYES: Pupils equal, round, reactive to light and accommodation. No scleral icterus. Extraocular muscles intact.  HEENT: Head atraumatic, normocephalic. Oropharynx and nasopharynx clear.  NECK:  Supple, no jugular venous distention. No thyroid enlargement, no tenderness.  LUNGS: Normal breath sounds bilaterally, no wheezing, rales, rhonchi. No use of accessory muscles of respiration.  CARDIOVASCULAR: S1, S2 normal. No murmurs, rubs, or gallops.  ABDOMEN: Soft, nontender,  nondistended. Bowel sounds present. No organomegaly or mass.  EXTREMITIES: No cyanosis, clubbing, + 1-2 edema on the LLE.  Right lower ext. Warm, red.      NEUROLOGIC: Cranial nerves II through XII are intact. No focal Motor or sensory deficits b/l.  Globally weak. PSYCHIATRIC: The patient is alert and oriented x 3.  SKIN: No obvious rash, lesion, or ulcer. Right lower extremity redness, warmth consistent with cellulitis.   LABORATORY PANEL:   CBC  Recent Labs Lab 12/21/15 0611  WBC 6.1  HGB 9.4*  HCT 30.3*  PLT 138*   ------------------------------------------------------------------------------------------------------------------  Chemistries   Recent Labs Lab 12/20/15 1258 12/21/15 0611  NA 136 138  K 4.7 4.4  CL 100* 107  CO2 26 24  GLUCOSE 93 105*  BUN 48* 43*  CREATININE 3.42* 3.16*  CALCIUM 9.0 8.5*  AST 45*  --   ALT 34  --   ALKPHOS 84  --   BILITOT 0.5  --    ------------------------------------------------------------------------------------------------------------------  Cardiac Enzymes No results for input(s): TROPONINI in the last 168 hours. ------------------------------------------------------------------------------------------------------------------  RADIOLOGY:  [place] Renal  12/21/2015  CLINICAL DATA:  Acute renal insufficiency. EXAM: RENAL / URINARY TRACT ULTRASOUND COMPLETE COMPARISON:  None. FINDINGS: Right Kidney: Length: 10.9 cm. Echogenicity within normal limits. No mass or hydronephrosis visualized. Left Kidney: Length: 11.2 cm. Echogenicity within normal limits. No mass or hydronephrosis visualized. Bladder: Appears normal for degree of bladder distention. IMPRESSION: Normal renal ultrasound. Electronically Signed   By: Ted Mcalpineobrinka  Dimitrova M.D.   On: 12/21/2015 12:11     ASSESSMENT AND PLAN:   75 year old female with past medical history of diabetes, hypertension, CHF, diabetic neuropathy, COPD, hyperlipidemia who presented to the  hospital  due to right lower extremity redness swelling and pain and also noted to be in acute renal failure.  1. Acute kidney injury-secondary to ATN. Also use of Bactrim, ACE inhibitor. -Appreciate nephrology input, renal ultrasound negative for any hydronephrosis.  -continue to hold diuretics, ace. Gentle hydration with IV fluids and follow BUN/creatinine. Baseline creatinine 1.2.  2. Cellulitis-right lower extremity. - failed oral outpatient abx.  Cont. Zosyn.   3. Diabetes type 2 without complication-d/c glipizide. Cont. SSI.   4. 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 -Clinically patient is not in congestive heart failure. Hold Lasix, ace due to acute renal failure. Continue Coreg.  5. Hyperlipidemia-continue Pravachol.  6. GERD-continue Protonix.  7. COPD-no acute exacerbation. Continue Dulera.  Will get PT eval.   All the records are reviewed and case discussed with Care Management/Social Workerr. Management plans discussed with the patient, family and they are in agreement.  CODE STATUS: Full  DVT Prophylaxis: Lovenox  TOTAL TIME TAKING CARE OF THIS PATIENT: 30 minutes.   POSSIBLE D/C IN 2-3 DAYS, DEPENDING ON CLINICAL CONDITION.   Houston Siren M.D on 12/21/2015 at 2:50 PM  Between 7am to 6pm - Pager - [phone]  After 6pm go to [email or web address] - password [email or web address] [facility]  [facility]  Office  [phone]  CC: Primary care physician; No primary care provider on file.

## 2015-12-22 LAB — PROTEIN ELECTRO, RANDOM URINE
ALPHA-1-GLOBULIN, U: 5.3 %
ALPHA-2-GLOBULIN, U: 22.2 %
Albumin ELP, Urine: 20.5 %
Beta Globulin, U: 34.8 %
GAMMA GLOBULIN, U: 17.2 %
Total Protein, Urine: 39.2 mg/dL

## 2015-12-22 LAB — GLUCOSE, CAPILLARY
GLUCOSE-CAPILLARY: 160 mg/dL — AB (ref 65–99)
Glucose-Capillary: 119 mg/dL — ABNORMAL HIGH (ref 65–99)
Glucose-Capillary: 155 mg/dL — ABNORMAL HIGH (ref 65–99)
Glucose-Capillary: 174 mg/dL — ABNORMAL HIGH (ref 65–99)

## 2015-12-22 LAB — BASIC METABOLIC PANEL
ANION GAP: 6 (ref 5–15)
BUN: 29 mg/dL — ABNORMAL HIGH (ref 6–20)
CALCIUM: 8.4 mg/dL — AB (ref 8.9–10.3)
CHLORIDE: 111 mmol/L (ref 101–111)
CO2: 21 mmol/L — AB (ref 22–32)
Creatinine, Ser: 1.92 mg/dL — ABNORMAL HIGH (ref 0.44–1.00)
GFR calc Af Amer: 29 mL/min — ABNORMAL LOW (ref >60)
GFR calc non Af Amer: 25 mL/min — ABNORMAL LOW (ref >60)
GLUCOSE: 115 mg/dL — AB (ref 65–99)
Potassium: 4.3 mmol/L (ref 3.5–5.1)
Sodium: 138 mmol/L (ref 135–145)

## 2015-12-22 LAB — PROTEIN ELECTROPHORESIS, SERUM
A/G RATIO SPE: 0.9 (ref 0.7–1.7)
ALPHA-1-GLOBULIN: 0.5 g/dL — AB (ref 0.0–0.4)
ALPHA-2-GLOBULIN: 1 g/dL (ref 0.4–1.0)
Albumin ELP: 3.1 g/dL (ref 2.9–4.4)
BETA GLOBULIN: 1 g/dL (ref 0.7–1.3)
Gamma Globulin: 1.2 g/dL (ref 0.4–1.8)
Globulin, Total: 3.6 g/dL (ref 2.2–3.9)
Total Protein ELP: 6.7 g/dL (ref 6.0–8.5)

## 2015-12-22 LAB — URINE CULTURE: Culture: 4000 — AB

## 2015-12-22 LAB — ANA W/REFLEX IF POSITIVE: ANA: NEGATIVE

## 2015-12-22 MED ORDER — VANCOMYCIN HCL IN DEXTROSE 750-5 MG/150ML-% IV SOLN
750.0000 mg | INTRAVENOUS | Status: AC
  Administered 2015-12-22: 750 mg via INTRAVENOUS
  Filled 2015-12-22: qty 150

## 2015-12-22 MED ORDER — IPRATROPIUM BROMIDE 0.02 % IN SOLN
0.5000 mg | Freq: Two times a day (BID) | RESPIRATORY_TRACT | Status: DC
  Administered 2015-12-23 – 2015-12-24 (×3): 0.5 mg via RESPIRATORY_TRACT
  Filled 2015-12-22 (×3): qty 2.5

## 2015-12-22 MED ORDER — VANCOMYCIN HCL IN DEXTROSE 750-5 MG/150ML-% IV SOLN
750.0000 mg | INTRAVENOUS | Status: DC
  Administered 2015-12-22: 750 mg via INTRAVENOUS
  Filled 2015-12-22 (×2): qty 150

## 2015-12-22 MED ORDER — VANCOMYCIN HCL IN DEXTROSE 750-5 MG/150ML-% IV SOLN
750.0000 mg | INTRAVENOUS | Status: DC
  Filled 2015-12-22: qty 150

## 2015-12-22 MED ORDER — ENOXAPARIN SODIUM 40 MG/0.4ML ~~LOC~~ SOLN
40.0000 mg | SUBCUTANEOUS | Status: DC
  Administered 2015-12-22 – 2015-12-23 (×2): 40 mg via SUBCUTANEOUS
  Filled 2015-12-22 (×2): qty 0.4

## 2015-12-22 NOTE — Progress Notes (Signed)
Physical Therapy Treatment Patient Details Name: Ann KannerGracie Boone MRN: [id number] DOB: 11/19/1940 Today's Date: 12/22/2015    History of Present Illness Pt is a 75 y.o. female presenting with abnormal labs and L LE swelling, pain, and redness.  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  PMH includes DM, htn, asthma, and h/o CHF.    PT Comments    Pt able to tolerate significant increase in distance today ambulating with RW.  No loss of balance noted although increased L lateral sway noted with distance and mild antalgic gait noted.  Pt demonstrates improved tolerance to functional mobility overall today compared to yesterday.  Will continue to progress pt with independence with functional mobility and transition to least restrictive AD for ambulation as appropriate.   Follow Up Recommendations   (OP PT pending pt's progress)     Equipment Recommendations  Rolling walker with 5" wheels    Recommendations for Other Services       Precautions / Restrictions Precautions Precautions: Fall Restrictions Weight Bearing Restrictions: No    Mobility  Bed Mobility Overal bed mobility: Modified Independent Bed Mobility: Supine to Sit;Sit to Supine     Supine to sit: Modified independent (Device/Increase time);HOB elevated Sit to supine: Modified independent (Device/Increase time);HOB elevated      Transfers Overall transfer level: Needs assistance Equipment used: Rolling walker (2 wheeled) Transfers: Sit to/from Stand Sit to Stand: Supervision;Min guard         General transfer comment: steady without loss of balance  Ambulation/Gait Ambulation/Gait assistance: Supervision;Min guard Ambulation Distance (Feet): 280 Feet Assistive device: Rolling walker (2 wheeled)   Gait velocity: decreased   General Gait Details: antalgic gait; decreased stance time L LE; mild increased L lateral lean with distance   Stairs            Wheelchair Mobility     Modified Rankin (Stroke Patients Only)       Balance Overall balance assessment: Needs assistance Sitting-balance support: No upper extremity supported;Feet supported Sitting balance-Leahy Scale: Normal     Standing balance support: Bilateral upper extremity supported (on RW) Standing balance-Leahy Scale: Good                      Cognition Arousal/Alertness: Awake/alert Behavior During Therapy: WFL for tasks assessed/performed Overall Cognitive Status: Within Functional Limits for tasks assessed                      Exercises      General Comments General comments (skin integrity, edema, etc.): L LE swelling; R LE redness  Nursing cleared pt for participation in physical therapy.  Pt agreeable to PT session.      Pertinent Vitals/Pain Pain Assessment: 0-10 Pain Score: 7  Pain Location: headache Pain Descriptors / Indicators: Headache Pain Intervention(s): Limited activity within patient's tolerance;Monitored during session;Repositioned;Patient requesting pain meds-RN notified  Vitals stable and WFL throughout treatment session.    Home Living                      Prior Function            PT Goals (current goals can now be found in the care plan section) Acute Rehab PT Goals Patient Stated Goal: to get OOB and walk PT Goal Formulation: With patient Time For Goal Achievement: 01/04/16 Potential to Achieve Goals: Good Progress towards PT goals: Progressing toward goals    Frequency  Min 2X/week  PT Plan Discharge plan needs to be updated    Co-evaluation             End of Session Equipment Utilized During Treatment: Gait belt Activity Tolerance: Patient tolerated treatment well Patient left: in bed;with call bell/phone within reach;with bed alarm set;with family/visitor present (B heels elevated via pillows)     Time: 1610-9604 PT Time Calculation (min) (ACUTE ONLY): 15 min  Charges:  $Therapeutic Exercise: 8-22  mins                    G CodesHendricks Limes 12/27/15, 3:34 PM Hendricks Limes, PT [phone]

## 2015-12-22 NOTE — Progress Notes (Addendum)
Pharmacy Antibiotic Note  Ann KannerGracie Boone is a 75 y.o. female admitted on 12/20/2015 with cellulitis.  Pharmacy has been consulted for vancomycin & piperacillin/tazobactam dosing.  Patient has received cephalexin, clindamycin, and SMX/TMP outpatient prior to admission for cellulitis. Vancomycin and piperacillin/tazobactam started on admission. Patient also has AKI.  Plan: Renal function much improved today. Patient received Vancomycin 1500 mg IV once on 5/22. Trough of 6 on 5/23 at 2059 and no additional doses of Vancomycin administered.  Based on improved renal function/kinetics, will transition patient to Vancomcyin 750 mg IV q24h.  Will order a dose for Stat and give an additional dose at 2130 for stacked dosing per pharmacy as it is assumed that vancomycin serum level is negligible. Goal vancomycin trough 10-15 mcg/mL.  Patient at risk for accumulation due to BMI.  Will check trough prior to 4th dose on 5/27 at 2100.   AdjBW: 78.1 kg, est CrCl~32.2 mL/min, ke: 0.031, t1/2: 22.4 h, Vd: 54.7 L   Continue Zosyn 3.375 gm IV q8h per EI protocol based on current renal function.   Height: 5\' 6"  (167.6 cm) Weight: 234 lb 1.6 oz (106.187 kg) IBW/kg (Calculated) : 59.3  Temp (24hrs), Avg:99.8 F (37.7 C), Min:98.3 F (36.8 C), Max:101.7 F (38.7 C)   Recent Labs Lab 12/20/15 1258 12/20/15 1640 12/20/15 1940 12/21/15 0611 12/21/15 2059 12/22/15 0342  WBC 6.5  --   --  6.1  --   --   CREATININE 3.42*  --   --  3.16*  --  1.92*  LATICACIDVEN  --  1.0 2.4*  --   --   --   VANCORANDOM  --   --   --   --  6  --     Estimated Creatinine Clearance: 32.2 mL/min (by C-G formula based on Cr of 1.92).    No Known Allergies  Antimicrobials this admission: vancomycin 5/22 >>  Piperacillin/tazobactam 5/22 >>   Dose adjustments this admission:  Microbiology results: 5/22 BCx: NG x 2 days 5/22 UCx: Sent   Thank you for allowing pharmacy to be a part of this patient's  care.  Sherry RuffingScarpena,Kennede Lusk G, PharmD Clinical Pharmacist 12/22/2015 8:33 AM

## 2015-12-22 NOTE — Care Management (Signed)
Patient is in agreement with home health SN and PT. [facility] preference is [facility].  Referral called.  Patient's Street address is [address], [place].  She confirms her phone number of [phone]

## 2015-12-22 NOTE — Progress Notes (Signed)
Anticoagulation Monitoring  Patient is a 75 yo female with orders for Lovenox 30 mg subq q24h for DVT prophylaxis.  Patient admitted with AKI.  SCr improved today with est CrCl~32 mL/min.  Per anticoagulation policy, will transition patient to Lovenox 40 mg subq q24h as CrCl >30 mL/min.  Pharmacy will continue to monitor per policy.  Ann Schoolsrystal Torian Quintero, PharmD Clinical Pharmacist 12/22/2015.

## 2015-12-22 NOTE — Progress Notes (Signed)
[facility]  ROUNDING NOTE   Subjective:  Renal function improving. Creatinine down to 1.9. Overall feeling well.   Objective:  Vital signs in last 24 hours:  Temp:  [98.6 F (37 C)-101.7 F (38.7 C)] 98.7 F (37.1 C) (05/24 1327) Pulse Rate:  [66-81] 66 (05/24 1327) Resp:  [16-18] 16 (05/24 1327) BP: (120-151)/(50-70) 122/61 mmHg (05/24 1327) SpO2:  [96 %-100 %] 100 % (05/24 1327) Weight:  [106.187 kg (234 lb 1.6 oz)] 106.187 kg (234 lb 1.6 oz) (05/24 0500)  Weight change: 3.674 kg (8 lb 1.6 oz) Filed Weights   12/20/15 1930 12/21/15 0440 12/22/15 0500  Weight: 101.56 kg (223 lb 14.4 oz) 105.507 kg (232 lb 9.6 oz) 106.187 kg (234 lb 1.6 oz)    Intake/Output: I/O last 3 completed shifts: In: 2741.7 [P.O.:720; I.V.:1971.7; IV Piggyback:50] Out: 750 [Urine:750]   Intake/Output this shift:  Total I/O In: -  Out: 900 [Urine:900]  Physical Exam: General: NAD, laying in bed  Head: Normocephalic, atraumatic. Moist oral mucosal membranes  Eyes: Anicteric  Neck: Supple, trachea midline  Lungs:  Clear to auscultation, normal effort  Heart: S1S2 no rubs  Abdomen:  Soft, nontender, BS present   Extremities: trace peripheral edema, darkening of skin LLE  Neurologic: Nonfocal, moving all four extremities  Skin: Hyperpigmentation of skin LLE       Basic Metabolic Panel:  Recent Labs Lab 12/20/15 1258 12/21/15 0611 12/22/15 0342  NA 136 138 138  K 4.7 4.4 4.3  CL 100* 107 111  CO2 26 24 21*  GLUCOSE 93 105* 115*  BUN 48* 43* 29*  CREATININE 3.42* 3.16* 1.92*  CALCIUM 9.0 8.5* 8.4*    Liver Function Tests:  Recent Labs Lab 12/20/15 1258  AST 45*  ALT 34  ALKPHOS 84  BILITOT 0.5  PROT 7.7  ALBUMIN 4.1   No results for input(s): LIPASE, AMYLASE in the last 168 hours. No results for input(s): AMMONIA in the last 168 hours.  CBC:  Recent Labs Lab 12/20/15 1258 12/21/15 0611  WBC 6.5 6.1  NEUTROABS 4.9  --   HGB 10.1* 9.4*  HCT 32.7*  30.3*  MCV 79.9* 80.7  PLT 176 138*    Cardiac Enzymes: No results for input(s): CKTOTAL, CKMB, CKMBINDEX, TROPONINI in the last 168 hours.  BNP: Invalid input(s): POCBNP  CBG:  Recent Labs Lab 12/21/15 0735 12/21/15 1630 12/21/15 2125 12/22/15 0724 12/22/15 1130  GLUCAP 109* 128* 132* 119* 155*    Microbiology: Results for orders placed or performed during the hospital encounter of 12/20/15  Blood Culture (routine x 2)     Status: None (Preliminary result)   Collection Time: 12/20/15  4:30 PM  Result Value Ref Range Status   Specimen Description BLOOD RIGHT ASSIST CONTROL  Final   Special Requests BOTTLES DRAWN AEROBIC AND ANAEROBIC  5CC  Final   Culture NO GROWTH 2 DAYS  Final   Report Status PENDING  Incomplete  Blood Culture (routine x 2)     Status: None (Preliminary result)   Collection Time: 12/20/15  4:38 PM  Result Value Ref Range Status   Specimen Description BLOOD LEFT  Final   Special Requests BOTTLES DRAWN AEROBIC AND ANAEROBIC  1CC  Final   Culture NO GROWTH 2 DAYS  Final   Report Status PENDING  Incomplete  Urine culture     Status: None (Preliminary result)   Collection Time: 12/21/15 11:43 AM  Result Value Ref Range Status   Specimen Description URINE,  RANDOM  Final   Special Requests NONE  Final   Culture   Final    TOO YOUNG TO READ Performed at [facility]    Report Status PENDING  Incomplete    Coagulation Studies: No results for input(s): LABPROT, INR in the last 72 hours.  Urinalysis:  Recent Labs  12/21/15 1143  COLORURINE YELLOW*  LABSPEC 1.014  PHURINE 5.0  GLUCOSEU NEGATIVE  HGBUR 1+*  BILIRUBINUR NEGATIVE  KETONESUR NEGATIVE  PROTEINUR 30*  NITRITE NEGATIVE  LEUKOCYTESUR NEGATIVE      Imaging: US Renal  12/21/2015  CLINICAL DATA:  Acute renal insufficiency. EXAM: RENAL / URINARY TRACT ULTRASOUND COMPLETE COMPARISON:  None. FINDINGS: Right Kidney: Length: 10.9 cm. Echogenicity within normal limits. No mass or  hydronephrosis visualized. Left Kidney: Length: 11.2 cm. Echogenicity within normal limits. No mass or hydronephrosis visualized. Bladder: Appears normal for degree of bladder distention. IMPRESSION: Normal renal ultrasound. Electronically Signed   By: Fidela Salisbury M.D.   On: 12/21/2015 12:11     Medications:   . sodium chloride 75 mL/hr at 12/22/15 0215   . aspirin EC  81 mg Oral Daily  . carvedilol  12.5 mg Oral BID WC  . cholecalciferol  1,000 Units Oral Daily  . docusate sodium  100 mg Oral BID  . enoxaparin (LOVENOX) injection  40 mg Subcutaneous Q24H  . ferrous sulfate  325 mg Oral Q breakfast  . gabapentin  300 mg Oral TID  . insulin aspart  0-5 Units Subcutaneous QHS  . insulin aspart  0-9 Units Subcutaneous TID WC  . ipratropium  0.5 mg Nebulization BID  . mometasone-formoterol  2 puff Inhalation BID  . montelukast  10 mg Oral QHS  . pantoprazole  40 mg Oral Daily  . pravastatin  10 mg Oral q1800  . vancomycin  500 mg Intravenous Once  . vancomycin  750 mg Intravenous Q24H   acetaminophen **OR** acetaminophen, fluticasone, ondansetron **OR** ondansetron (ZOFRAN) IV, traMADol  Assessment/ Plan:  75 y.o. female with a PMHx of Diabetes mellitus type 2, hypertension, asthma, who was admitted to [facility] on 12/20/2015 for evaluation of abnormal labs noted by her primary care physician. The patient apparently recently had left lower extremity swelling and cellulitis. This was apparently treated with Keflex, Bactrim, and clindamycin.   1. Acute renal failure/proteinuria/CKD stage III: Baseline Cr 1.3, egfr 47. Acute renal failure likely related to infection as well as medications with contribution from Bactrim. Renal ultrasound negative.  - renal function has improved.  Creatinine down to 1.9 but this is still above her baseline.  Continue IV fluid hydration for ne additional day.  2. Hypertension: blood pressure currently 128/50.  Continue the patient on carvedilol.   LOS:  2 LATEEF, MUNSOOR 5/24/20172:45 PM

## 2015-12-22 NOTE — Progress Notes (Signed)
[employer] - [place] at [facility]   PATIENT NAME: Ann Boone    MR#:  [id number]  DATE OF BIRTH:  05/03/1941  SUBJECTIVE:   Patient here due to acute kidney injury and also noted to have right lower extremity cellulitis. Overall feels better. Cr. Trending down.   REVIEW OF SYSTEMS:    Review of Systems  Constitutional: Negative for fever and chills.  HENT: Negative for congestion and tinnitus.   Eyes: Negative for blurred vision and double vision.  Respiratory: Negative for cough, shortness of breath and wheezing.   Cardiovascular: Negative for chest pain, orthopnea and PND.  Gastrointestinal: Negative for nausea, vomiting, abdominal pain and diarrhea.  Genitourinary: Negative for dysuria and hematuria.  Skin: Positive for rash (right lower extremity cellulitis).  Neurological: Positive for weakness (generalized). Negative for dizziness, sensory change and focal weakness.  All other systems reviewed and are negative.   Nutrition: Heart Health/Carb modified Tolerating Diet: yes Tolerating PT: PT Eval noted.   DRUG ALLERGIES:  No Known Allergies  VITALS:  Blood pressure 122/61, pulse 66, temperature 98.7 F (37.1 C), temperature source Oral, resp. rate 16, height 5\' 6"  (1.676 m), weight 106.187 kg (234 lb 1.6 oz), SpO2 100 %.  PHYSICAL EXAMINATION:   Physical Exam  GENERAL:  75 y.o.-year-old patient lying in the bed in no acute distress.  EYES: Pupils equal, round, reactive to light and accommodation. No scleral icterus. Extraocular muscles intact.  HEENT: Head atraumatic, normocephalic. Oropharynx and nasopharynx clear.  NECK:  Supple, no jugular venous distention. No thyroid enlargement, no tenderness.  LUNGS: Normal breath sounds bilaterally, no wheezing, rales, rhonchi. No use of accessory muscles of respiration.  CARDIOVASCULAR: S1, S2 normal. No murmurs, rubs, or gallops.  ABDOMEN: Soft, nontender, nondistended. Bowel sounds present. No  organomegaly or mass.  EXTREMITIES: No cyanosis, clubbing, + 1-2 edema on the LLE.  Right lower ext. Warm, red.      NEUROLOGIC: Cranial nerves II through XII are intact. No focal Motor or sensory deficits b/l.  Globally weak. PSYCHIATRIC: The patient is alert and oriented x 3.  SKIN: No obvious rash, lesion, or ulcer. Right lower extremity redness, warmth consistent with cellulitis.   LABORATORY PANEL:   CBC  Recent Labs Lab 12/21/15 0611  WBC 6.1  HGB 9.4*  HCT 30.3*  PLT 138*   ------------------------------------------------------------------------------------------------------------------  Chemistries   Recent Labs Lab 12/20/15 1258  12/22/15 0342  NA 136  < > 138  K 4.7  < > 4.3  CL 100*  < > 111  CO2 26  < > 21*  GLUCOSE 93  < > 115*  BUN 48*  < > 29*  CREATININE 3.42*  < > 1.92*  CALCIUM 9.0  < > 8.4*  AST 45*  --   --   ALT 34  --   --   ALKPHOS 84  --   --   BILITOT 0.5  --   --   < > = values in this interval not displayed. ------------------------------------------------------------------------------------------------------------------  Cardiac Enzymes No results for input(s): TROPONINI in the last 168 hours. ------------------------------------------------------------------------------------------------------------------  RADIOLOGY:  [place] Renal  12/21/2015  CLINICAL DATA:  Acute renal insufficiency. EXAM: RENAL / URINARY TRACT ULTRASOUND COMPLETE COMPARISON:  None. FINDINGS: Right Kidney: Length: 10.9 cm. Echogenicity within normal limits. No mass or hydronephrosis visualized. Left Kidney: Length: 11.2 cm. Echogenicity within normal limits. No mass or hydronephrosis visualized. Bladder: Appears normal for degree of bladder distention. IMPRESSION: Normal renal ultrasound. Electronically Signed  By: Ted Mcalpine M.D.   On: 12/21/2015 12:11     ASSESSMENT AND PLAN:   75 year old female with past medical history of diabetes, hypertension, CHF,  diabetic neuropathy, COPD, hyperlipidemia who presented to the hospital due to right lower extremity redness swelling and pain and also noted to be in acute renal failure.  1. Acute kidney injury-secondary to ATN. Also use of Bactrim, ACE inhibitor. -Appreciate nephrology input, renal ultrasound negative for any hydronephrosis.  -continue to hold diuretics, ace. Improving w./ IV fluids and will follow. Baseline Cr. 1.2.    2. Cellulitis-right lower extremity. - failed oral outpatient abx.  Cont. Vanc.  D/c Zosyn.    3. Diabetes type 2 without complication-Cont. SSI.  - BS stable.  4. 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 -Clinically patient is not in congestive heart failure. Hold Lasix, ace due to acute renal failure. Continue Coreg.  5. Hyperlipidemia-continue Pravachol.  6. GERD-continue Protonix.  7. COPD-no acute exacerbation. Continue Dulera.   PT eval noted and will arrange home health upon discharge.   All the records are reviewed and case discussed with Care Management/Social Workerr. Management plans discussed with the patient, family and they are in agreement.  CODE STATUS: Full  DVT Prophylaxis: Lovenox  TOTAL TIME TAKING CARE OF THIS PATIENT: 30 minutes.   POSSIBLE D/C IN 1-2 DAYS, DEPENDING ON CLINICAL CONDITION.   Houston Siren M.D on 12/22/2015 at 2:20 PM  Between 7am to 6pm - Pager - [phone]  After 6pm go to [email or web address] - password [email or web address]  [facility]  Office  [phone]  CC: Primary care physician; No primary care provider on file.

## 2015-12-23 LAB — BASIC METABOLIC PANEL
Anion gap: 6 (ref 5–15)
BUN: 20 mg/dL (ref 6–20)
CO2: 23 mmol/L (ref 22–32)
CREATININE: 1.48 mg/dL — AB (ref 0.44–1.00)
Calcium: 8.5 mg/dL — ABNORMAL LOW (ref 8.9–10.3)
Chloride: 112 mmol/L — ABNORMAL HIGH (ref 101–111)
GFR calc Af Amer: 39 mL/min — ABNORMAL LOW (ref >60)
GFR, EST NON AFRICAN AMERICAN: 34 mL/min — AB (ref >60)
Glucose, Bld: 145 mg/dL — ABNORMAL HIGH (ref 65–99)
POTASSIUM: 4.2 mmol/L (ref 3.5–5.1)
SODIUM: 141 mmol/L (ref 135–145)

## 2015-12-23 LAB — GLUCOSE, CAPILLARY
GLUCOSE-CAPILLARY: 135 mg/dL — AB (ref 65–99)
Glucose-Capillary: 155 mg/dL — ABNORMAL HIGH (ref 65–99)
Glucose-Capillary: 123 mg/dL — ABNORMAL HIGH (ref 65–99)
Glucose-Capillary: 126 mg/dL — ABNORMAL HIGH (ref 65–99)

## 2015-12-23 MED ORDER — CEFTRIAXONE SODIUM 2 G IJ SOLR
2.0000 g | INTRAMUSCULAR | Status: DC
  Administered 2015-12-23 – 2015-12-24 (×2): 2 g via INTRAVENOUS
  Filled 2015-12-23 (×3): qty 2

## 2015-12-23 NOTE — Care Management Important Message (Signed)
Important Message  Patient Details  Name: Ann Boone MRN: [id number] Date of Birth: 09/18/1940   Medicare Important Message Given:  Yes    Olegario MessierKathy A Caytlin Better 12/23/2015, 11:00 AM

## 2015-12-23 NOTE — Progress Notes (Signed)
Pharmacy Antibiotic Note  Ann KannerGracie Argueta is a 75 y.o. female admitted on 12/20/2015 with cellulitis.  Pharmacy has been consulted for vancomycin & piperacillin/tazobactam dosing.  Patient has received cephalexin, clindamycin, and SMX/TMP outpatient prior to admission for cellulitis. Vancomycin and piperacillin/tazobactam started on admission. Patient also has AKI.  Plan: Renal function continues to improve today. Patient received Vancomycin 1000 mg IV once on 5/22 (additional 500 mg dose not charted as given). Trough of 6 on 5/23 at 2059 and no additional doses of Vancomycin administered.  Based on current renal function/kinetics, will continue Vancomcyin 750 mg IV q24h.  Goal vancomycin trough 10-15 mcg/mL.  Patient at risk for accumulation due to BMI.  Will check trough prior to 4th dose on 5/27 at 2100. Will order SCr for AM to continue to monitor renal function.   AdjBW: 76.1 kg, est CrCl~32 mL/min, ke: 0.031, t1/2: 22.4 h, Vd: 53.3 L  Zosyn d/c'd 5/24.  [facility] obtained fax of WCx.  WCx growing moderate growth Kleb pneumo sensitive to Amox/clav, ceftriaxone, cipro, levofloxacin, and Zosyn among others (resistant to ampicillin; cefazolin not reported).  Spoke with MD, will order ceftriaxone 2 g IV q24h.    Height: 5\' 6"  (167.6 cm) Weight: 223 lb 8 oz (101.379 kg) IBW/kg (Calculated) : 59.3  Temp (24hrs), Avg:98.5 F (36.9 C), Min:98 F (36.7 C), Max:98.8 F (37.1 C)   Recent Labs Lab 12/20/15 1258 12/20/15 1640 12/20/15 1940 12/21/15 0611 12/21/15 2059 12/22/15 0342 12/23/15 0417  WBC 6.5  --   --  6.1  --   --   --   CREATININE 3.42*  --   --  3.16*  --  1.92* 1.48*  LATICACIDVEN  --  1.0 2.4*  --   --   --   --   VANCORANDOM  --   --   --   --  6  --   --     Estimated Creatinine Clearance: 40.7 mL/min (by C-G formula based on Cr of 1.48).    No Known Allergies  Antimicrobials this admission: vancomycin 5/22 >>   Piperacillin/tazobactam 5/22 >> 5/24 Ceftriaxone 5/25 >>  Dose adjustments this admission:  Microbiology results: 5/22 BCx: NG x 2 days 5/22 UCx: Sent   Thank you for allowing pharmacy to be a part of this patient's care.  Marty HeckWang, Adalee Kathan L, PharmD Clinical Pharmacist 12/23/2015 9:52 AM

## 2015-12-23 NOTE — Progress Notes (Signed)
[facility]  ROUNDING NOTE   Subjective:  Renal function continues to improve. Creatinine currently down to 1.4. Overall feeling much better as compared to admission.   Objective:  Vital signs in last 24 hours:  Temp:  [98 F (36.7 C)-98.8 F (37.1 C)] 98 F (36.7 C) (05/25 0453) Pulse Rate:  [66-72] 72 (05/25 0453) Resp:  [16-24] 16 (05/25 0453) BP: (122-160)/(52-61) 160/60 mmHg (05/25 0453) SpO2:  [96 %-100 %] 96 % (05/25 0710) Weight:  [101.379 kg (223 lb 8 oz)] 101.379 kg (223 lb 8 oz) (05/25 0500)  Weight change: -4.808 kg (-10 lb 9.6 oz) Filed Weights   12/21/15 0440 12/22/15 0500 12/23/15 0500  Weight: 105.507 kg (232 lb 9.6 oz) 106.187 kg (234 lb 1.6 oz) 101.379 kg (223 lb 8 oz)    Intake/Output: I/O last 3 completed shifts: In: 3336.7 [P.O.:480; I.V.:2856.7] Out: 2350 [Urine:2350]   Intake/Output this shift:  Total I/O In: 240 [P.O.:240] Out: -   Physical Exam: General: NAD, laying in bed  Head: Normocephalic, atraumatic. Moist oral mucosal membranes  Eyes: Anicteric  Neck: Supple, trachea midline  Lungs:  Clear to auscultation, normal effort  Heart: S1S2 no rubs  Abdomen:  Soft, nontender, BS present   Extremities: trace peripheral edema, darkening of skin LLE  Neurologic: Nonfocal, moving all four extremities  Skin: Hyperpigmentation of skin LLE       Basic Metabolic Panel:  Recent Labs Lab 12/20/15 1258 12/21/15 0611 12/22/15 0342 12/23/15 0417  NA 136 138 138 141  K 4.7 4.4 4.3 4.2  CL 100* 107 111 112*  CO2 26 24 21* 23  GLUCOSE 93 105* 115* 145*  BUN 48* 43* 29* 20  CREATININE 3.42* 3.16* 1.92* 1.48*  CALCIUM 9.0 8.5* 8.4* 8.5*    Liver Function Tests:  Recent Labs Lab 12/20/15 1258  AST 45*  ALT 34  ALKPHOS 84  BILITOT 0.5  PROT 7.7  ALBUMIN 4.1   No results for input(s): LIPASE, AMYLASE in the last 168 hours. No results for input(s): AMMONIA in the last 168 hours.  CBC:  Recent Labs Lab  12/20/15 1258 12/21/15 0611  WBC 6.5 6.1  NEUTROABS 4.9  --   HGB 10.1* 9.4*  HCT 32.7* 30.3*  MCV 79.9* 80.7  PLT 176 138*    Cardiac Enzymes: No results for input(s): CKTOTAL, CKMB, CKMBINDEX, TROPONINI in the last 168 hours.  BNP: Invalid input(s): POCBNP  CBG:  Recent Labs Lab 12/22/15 0724 12/22/15 1130 12/22/15 1729 12/22/15 2158 12/23/15 0728  GLUCAP 119* 155* 160* 174* 135*    Microbiology: Results for orders placed or performed during the hospital encounter of 12/20/15  Blood Culture (routine x 2)     Status: None (Preliminary result)   Collection Time: 12/20/15  4:30 PM  Result Value Ref Range Status   Specimen Description BLOOD RIGHT ASSIST CONTROL  Final   Special Requests BOTTLES DRAWN AEROBIC AND ANAEROBIC  5CC  Final   Culture NO GROWTH 3 DAYS  Final   Report Status PENDING  Incomplete  Blood Culture (routine x 2)     Status: None (Preliminary result)   Collection Time: 12/20/15  4:38 PM  Result Value Ref Range Status   Specimen Description BLOOD LEFT  Final   Special Requests BOTTLES DRAWN AEROBIC AND ANAEROBIC  1CC  Final   Culture NO GROWTH 3 DAYS  Final   Report Status PENDING  Incomplete  Urine culture     Status: Abnormal   Collection Time:  12/21/15 11:43 AM  Result Value Ref Range Status   Specimen Description URINE, RANDOM  Final   Special Requests NONE  Final   Culture (A)  Final    4,000 COLONIES/mL INSIGNIFICANT GROWTH Performed at [facility]    Report Status 12/22/2015 FINAL  Final    Coagulation Studies: No results for input(s): LABPROT, INR in the last 72 hours.  Urinalysis:  Recent Labs  12/21/15 1143  COLORURINE YELLOW*  LABSPEC 1.014  PHURINE 5.0  GLUCOSEU NEGATIVE  HGBUR 1+*  BILIRUBINUR NEGATIVE  KETONESUR NEGATIVE  PROTEINUR 30*  NITRITE NEGATIVE  LEUKOCYTESUR NEGATIVE      Imaging: US Renal  12/21/2015  CLINICAL DATA:  Acute renal insufficiency. EXAM: RENAL / URINARY TRACT ULTRASOUND COMPLETE  COMPARISON:  None. FINDINGS: Right Kidney: Length: 10.9 cm. Echogenicity within normal limits. No mass or hydronephrosis visualized. Left Kidney: Length: 11.2 cm. Echogenicity within normal limits. No mass or hydronephrosis visualized. Bladder: Appears normal for degree of bladder distention. IMPRESSION: Normal renal ultrasound. Electronically Signed   By: Fidela Salisbury M.D.   On: 12/21/2015 12:11     Medications:   . sodium chloride 75 mL/hr at 12/23/15 0733   . aspirin EC  81 mg Oral Daily  . carvedilol  12.5 mg Oral BID WC  . cefTRIAXone (ROCEPHIN)  IV  2 g Intravenous Q24H  . cholecalciferol  1,000 Units Oral Daily  . docusate sodium  100 mg Oral BID  . enoxaparin (LOVENOX) injection  40 mg Subcutaneous Q24H  . ferrous sulfate  325 mg Oral Q breakfast  . gabapentin  300 mg Oral TID  . insulin aspart  0-5 Units Subcutaneous QHS  . insulin aspart  0-9 Units Subcutaneous TID WC  . ipratropium  0.5 mg Nebulization BID  . mometasone-formoterol  2 puff Inhalation BID  . montelukast  10 mg Oral QHS  . pantoprazole  40 mg Oral Daily  . pravastatin  10 mg Oral q1800   acetaminophen **OR** acetaminophen, fluticasone, ondansetron **OR** ondansetron (ZOFRAN) IV, traMADol  Assessment/ Plan:  74 y.o. female with a PMHx of Diabetes mellitus type 2, hypertension, asthma, who was admitted to [facility] on 12/20/2015 for evaluation of abnormal labs noted by her primary care physician. The patient apparently recently had left lower extremity swelling and cellulitis. This was apparently treated with Keflex, Bactrim, and clindamycin.   1. Acute renal failure/proteinuria/CKD stage III: Baseline Cr 1.3, egfr 47. Acute renal failure likely related to infection as well as medications with contribution from Bactrim. Renal ultrasound negative. Negative spep/upep/ana - Renal function continues to improve. Creatinine down to 1.4. Close to baseline now. Continue hydration until she is discharged.  2.  Hypertension: Blood pressure currently 160/60. Continue Coreg 12.5 mg by mouth twice a day. Consider restarting benazepril as an outpatient.  3.  Pt will need follow up with Dr. Juleen China in [place].    LOS: 3 Ann Boone 5/25/201710:54 AM

## 2015-12-23 NOTE — Progress Notes (Signed)
[employer] [facility] [place] at [facility]   PATIENT NAME: Ann Boone    MR#:  [id number]  DATE OF BIRTH:  August 23, 1940  SUBJECTIVE:   Patient here due to acute kidney injury and also noted to have  Left  lower extremity cellulitis. Overall feels better. Cr. Trending down.Marland Kitchen  REVIEW OF SYSTEMS:    Review of Systems  Constitutional: Negative for fever and chills.  HENT: Negative for congestion, hearing loss and tinnitus.   Eyes: Negative for blurred vision, double vision and photophobia.  Respiratory: Negative for cough, hemoptysis, shortness of breath and wheezing.   Cardiovascular: Negative for chest pain, palpitations, orthopnea, leg swelling and PND.  Gastrointestinal: Negative for nausea, vomiting, abdominal pain and diarrhea.  Genitourinary: Negative for dysuria, urgency and hematuria.  Musculoskeletal: Negative for myalgias and neck pain.  Skin: Positive for rash (right lower extremity cellulitis).       Left leg cellulitis.  Neurological: Positive for weakness (generalized). Negative for dizziness, sensory change, focal weakness, seizures and headaches.  Psychiatric/Behavioral: Negative for memory loss. The patient does not have insomnia.   All other systems reviewed and are negative.  Pt does not have right leg cellulitis,only left leg cellulitis. Nutrition: Heart Health/Carb modified Tolerating Diet: yes Tolerating PT: PT Eval noted.   DRUG ALLERGIES:  No Known Allergies  VITALS:  Blood pressure 160/60, pulse 72, temperature 98 F (36.7 C), temperature source Oral, resp. rate 16, height  (1.676 m), weight 101.379 kg (223 lb 8 oz), SpO2 96 %.  PHYSICAL EXAMINATION:   Physical Exam  GENERAL:  75 y.o.-year-old patient lying in the bed in no acute distress.  EYES: Pupils equal, round, reactive to light and accommodation. No scleral icterus. Extraocular muscles intact.  HEENT: Head atraumatic, normocephalic. Oropharynx and nasopharynx clear.  NECK:   Supple, no jugular venous distention. No thyroid enlargement, no tenderness.  LUNGS: Normal breath sounds bilaterally, no wheezing, rales, rhonchi. No use of accessory muscles of respiration.  CARDIOVASCULAR: S1, S2 normal. No murmurs, rubs, or gallops.  ABDOMEN: Soft, nontender, nondistended. Bowel sounds present. No organomegaly or mass.  EXTREMITIES: No cyanosis, clubbing, + 1-2 edema on the LLE.  Left  Leg red,but better than yesterday NEUROLOGIC: Cranial nerves II through XII are intact. No focal Motor or sensory deficits b/l.  Globally weak. PSYCHIATRIC: The patient is alert and oriented x 3.  SKIN: No obvious rash, lesion, or ulcer.    LABORATORY PANEL:   CBC  Recent Labs Lab 12/21/15 0611  WBC 6.1  HGB 9.4*  HCT 30.3*  PLT 138*   ------------------------------------------------------------------------------------------------------------------  Chemistries   Recent Labs Lab 12/20/15 1258  12/23/15 0417  NA 136  < > 141  K 4.7  < > 4.2  CL 100*  < > 112*  CO2 26  < > 23  GLUCOSE 93  < > 145*  BUN 48*  < > 20  CREATININE 3.42*  < > 1.48*  CALCIUM 9.0  < > 8.5*  AST 45*  --   --   ALT 34  --   --   ALKPHOS 84  --   --   BILITOT 0.5  --   --   < > = values in this interval not displayed. ------------------------------------------------------------------------------------------------------------------  Cardiac Enzymes No results for input(s): TROPONINI in the last 168 hours. ------------------------------------------------------------------------------------------------------------------  RADIOLOGY:  US Renal  12/21/2015  CLINICAL DATA:  Acute renal insufficiency. EXAM: RENAL / URINARY TRACT ULTRASOUND COMPLETE COMPARISON:  None. FINDINGS: Right Kidney: Length:  10.9 cm. Echogenicity within normal limits. No mass or hydronephrosis visualized. Left Kidney: Length: 11.2 cm. Echogenicity within normal limits. No mass or hydronephrosis visualized. Bladder: Appears  normal for degree of bladder distention. IMPRESSION: Normal renal ultrasound. Electronically Signed   By: Dobrinka  Dimitrova M.D.   On: 12/21/2015 12:11     ASSESSMENT AND PLAN:  Ann Boone 75 year old female with past medical history of diabetes, hypertension, CHF, diabetic neuropathy, COPD, hyperlipidemia who presented to the hospital due to right lower extremity redness swelling and pain and also noted to be in acute renal failure.  1. Acute kidney injury-secondary to ATN. Also use of Bactrim, ACE inhibitor.  She has chronic kidney disease stage III: Improving renal function. Patient can be discharged home tomorrow, continue IV hydration to discharge. ut, renal ultrasound negative for any hydronephrosis.  -continue to hold diuretics, ace. Improving w./ IV fluids and will follow. Baseline Cr. 1.2.    2. Left leg Cellulitis failed outpatient therapy, ; patient wound culture showing moderate growth of klebsiella as per outpatient wound culture data changes with antibiotics to Rocephin. Continue the IV Rocephin for today, discharge home with Augmentin tomorrow.  3. Diabetes type 2 without complication-Cont. SSI.  - BS stable.  4. 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 Chronic systolic heart failure without acute exacerbation: Stable. -Clinically patient is not in congestive heart failure. Hold Lasix, ace due to acute renal failure. Continue Coreg.  5. Hyperlipidemia-continue Pravachol.  6. GERD-continue Protonix.  7. COPD-no acute exacerbation. Continue Dulera.   PT eval noted and will arrange home health upon discharge.   All the records are reviewed and case discussed with Care Management/Social Workerr. Management plans discussed with the patient, family and they are in agreement.  CODE STATUS: Full  DVT Prophylaxis: Lovenox  TOTAL TIME TAKING CARE OF THIS PATIENT: 30 minutes.   POSSIBLE D/C IN 1-2 DAYS, DEPENDING ON CLINICAL CONDITION.   Katha HammingKONIDENA,Mead Slane M.D on 12/23/2015 at 10:58  AM  Between 7am to 6pm - Pager - [phone]  After 6pm go to [email or web address] - password [email or web address] [facility]  [facility]  Office  [phone]  CC: Primary care physician; No primary care provider on file.

## 2015-12-24 LAB — GLUCOSE, CAPILLARY: GLUCOSE-CAPILLARY: 109 mg/dL — AB (ref 65–99)

## 2015-12-24 LAB — CREATININE, SERUM
Creatinine, Ser: 1.09 mg/dL — ABNORMAL HIGH (ref 0.44–1.00)
GFR calc non Af Amer: 49 mL/min — ABNORMAL LOW (ref >60)
GFR, EST AFRICAN AMERICAN: 57 mL/min — AB (ref >60)

## 2015-12-24 MED ORDER — BENAZEPRIL HCL 20 MG PO TABS: 20.0000 mg | ORAL_TABLET | Freq: Every day | ORAL | Status: DC

## 2015-12-24 MED ORDER — AMOXICILLIN-POT CLAVULANATE 875-125 MG PO TABS: 1.0000 | ORAL_TABLET | Freq: Two times a day (BID) | ORAL | Status: DC

## 2015-12-24 NOTE — Progress Notes (Signed)
[facility]  ROUNDING NOTE   Subjective:  Creatinine now down to 1.09 which is below her baseline. Overall feeling quite well. Sitting up in chair.    Objective:  Vital signs in last 24 hours:  Temp:  [97.8 F (36.6 C)-98.4 F (36.9 C)] 98.4 F (36.9 C) (05/26 0515) Pulse Rate:  [64-68] 68 (05/26 0515) Resp:  [16-20] 16 (05/26 0515) BP: (131-166)/(51-65) 166/65 mmHg (05/26 0515) SpO2:  [96 %-99 %] 99 % (05/26 0515) Weight:  [104.01 kg (229 lb 4.8 oz)] 104.01 kg (229 lb 4.8 oz) (05/26 0100)  Weight change: 2.631 kg (5 lb 12.8 oz) Filed Weights   12/22/15 0500 12/23/15 0500 12/24/15 0100  Weight: 106.187 kg (234 lb 1.6 oz) 101.379 kg (223 lb 8 oz) 104.01 kg (229 lb 4.8 oz)    Intake/Output: I/O last 3 completed shifts: In: 3034 [P.O.:480; I.V.:2554] Out: 900 [Urine:900]   Intake/Output this shift:  Total I/O In: 219 [I.V.:219] Out: 150 [Urine:150]  Physical Exam: General: NAD, sitting up in chair  Head: Normocephalic, atraumatic. Moist oral mucosal membranes  Eyes: Anicteric  Neck: Supple, trachea midline  Lungs:  Clear to auscultation, normal effort  Heart: S1S2 no rubs  Abdomen:  Soft, nontender, BS present   Extremities: trace peripheral edema, darkening of skin LLE  Neurologic: Nonfocal, moving all four extremities  Skin: Hyperpigmentation of skin LLE       Basic Metabolic Panel:  Recent Labs Lab 12/20/15 1258 12/21/15 0611 12/22/15 0342 12/23/15 0417 12/24/15 0442  NA 136 138 138 141  --   K 4.7 4.4 4.3 4.2  --   CL 100* 107 111 112*  --   CO2 26 24 21* 23  --   GLUCOSE 93 105* 115* 145*  --   BUN 48* 43* 29* 20  --   CREATININE 3.42* 3.16* 1.92* 1.48* 1.09*  CALCIUM 9.0 8.5* 8.4* 8.5*  --     Liver Function Tests:  Recent Labs Lab 12/20/15 1258  AST 45*  ALT 34  ALKPHOS 84  BILITOT 0.5  PROT 7.7  ALBUMIN 4.1   No results for input(s): LIPASE, AMYLASE in the last 168 hours. No results for input(s): AMMONIA in the  last 168 hours.  CBC:  Recent Labs Lab 12/20/15 1258 12/21/15 0611  WBC 6.5 6.1  NEUTROABS 4.9  --   HGB 10.1* 9.4*  HCT 32.7* 30.3*  MCV 79.9* 80.7  PLT 176 138*    Cardiac Enzymes: No results for input(s): CKTOTAL, CKMB, CKMBINDEX, TROPONINI in the last 168 hours.  BNP: Invalid input(s): POCBNP  CBG:  Recent Labs Lab 12/23/15 0728 12/23/15 1126 12/23/15 1653 12/23/15 2208 12/24/15 0749  GLUCAP 135* 155* 123* 126* 109*    Microbiology: Results for orders placed or performed during the hospital encounter of 12/20/15  Blood Culture (routine x 2)     Status: None (Preliminary result)   Collection Time: 12/20/15  4:30 PM  Result Value Ref Range Status   Specimen Description BLOOD RIGHT ASSIST CONTROL  Final   Special Requests BOTTLES DRAWN AEROBIC AND ANAEROBIC  5CC  Final   Culture NO GROWTH 3 DAYS  Final   Report Status PENDING  Incomplete  Blood Culture (routine x 2)     Status: None (Preliminary result)   Collection Time: 12/20/15  4:38 PM  Result Value Ref Range Status   Specimen Description BLOOD LEFT  Final   Special Requests BOTTLES DRAWN AEROBIC AND ANAEROBIC  1CC  Final   Culture  NO GROWTH 3 DAYS  Final   Report Status PENDING  Incomplete  Urine culture     Status: Abnormal   Collection Time: 12/21/15 11:43 AM  Result Value Ref Range Status   Specimen Description URINE, RANDOM  Final   Special Requests NONE  Final   Culture (A)  Final    4,000 COLONIES/mL INSIGNIFICANT GROWTH Performed at [facility]    Report Status 12/22/2015 FINAL  Final    Coagulation Studies: No results for input(s): LABPROT, INR in the last 72 hours.  Urinalysis:  Recent Labs  12/21/15 1143  COLORURINE YELLOW*  LABSPEC 1.014  PHURINE 5.0  GLUCOSEU NEGATIVE  HGBUR 1+*  BILIRUBINUR NEGATIVE  KETONESUR NEGATIVE  PROTEINUR 30*  NITRITE NEGATIVE  LEUKOCYTESUR NEGATIVE      Imaging: No results found.   Medications:   . sodium chloride 75 mL/hr at  12/24/15 0517   . aspirin EC  81 mg Oral Daily  . carvedilol  12.5 mg Oral BID WC  . cefTRIAXone (ROCEPHIN)  IV  2 g Intravenous Q24H  . cholecalciferol  1,000 Units Oral Daily  . docusate sodium  100 mg Oral BID  . enoxaparin (LOVENOX) injection  40 mg Subcutaneous Q24H  . ferrous sulfate  325 mg Oral Q breakfast  . gabapentin  300 mg Oral TID  . insulin aspart  0-5 Units Subcutaneous QHS  . insulin aspart  0-9 Units Subcutaneous TID WC  . ipratropium  0.5 mg Nebulization BID  . mometasone-formoterol  2 puff Inhalation BID  . montelukast  10 mg Oral QHS  . pantoprazole  40 mg Oral Daily  . pravastatin  10 mg Oral q1800   acetaminophen **OR** acetaminophen, fluticasone, ondansetron **OR** ondansetron (ZOFRAN) IV, traMADol  Assessment/ Plan:  75 y.o. female with a PMHx of Diabetes mellitus type 2, hypertension, asthma, who was admitted to [facility] on 12/20/2015 for evaluation of abnormal labs noted by her primary care physician. The patient apparently recently had left lower extremity swelling and cellulitis. This was apparently treated with Keflex, Bactrim, and clindamycin.   1. Acute renal failure/proteinuria/CKD stage III: Baseline Cr 1.3, egfr 47. Acute renal failure likely related to infection as well as medications with contribution from Bactrim. Renal ultrasound negative. Negative spep/upep/ana - creatinine down to 1.09 which is actually below her baseline.  Her renal function may just once she is back home.  She will need continued monitoring as an outpatient.  2. Hypertension.  Blood pressure high at 166/65.  We will go ahead and restart the patient on benazapril.  3.  Pt will need follow up with Dr. Juleen China in [place].    LOS: 4 Adonte Vanriper 5/26/201711:07 AM

## 2015-12-24 NOTE — Care Management (Signed)
Spoke with attending regarding order for home health SN PT and Walker. Patient requests that [facility] provide walker.   [facility] aware of discharge home today and the need for walker.

## 2015-12-24 NOTE — Progress Notes (Signed)
Pt stable. IV removed. D/c instructions given and education provided. Signed prescriptions verified and given. Pt states she understands instructions. Pt dressed and escorted out by staff. Driven home by family.  

## 2015-12-24 NOTE — Progress Notes (Signed)
Physical Therapy Treatment Patient Details Name: Ann KannerGracie Dudas MRN: [id number] DOB: 08/12/1940 Today's Date: 12/24/2015    History of Present Illness Pt is a 75 y.o. female presenting with abnormal labs and L LE swelling, pain, and redness.  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  PMH includes DM, htn, asthma, and h/o CHF.    PT Comments    Pt mildly unsteady ambulating without AD (increasing unsteadiness with distance) but pt demonstrates steady gait with use of RW.  Pt scored 25/28 on Tinetti balance assessment indicating pt is at low risk for falls (using RW for ambulation).  Recommend pt discharge to home with HHPT, use of RW, and support of family (pt reports she has a lot of help from family).   Follow Up Recommendations  Home health PT     Equipment Recommendations  Rolling walker with 5" wheels    Recommendations for Other Services       Precautions / Restrictions Precautions Precautions: Fall Restrictions Weight Bearing Restrictions: No    Mobility  Bed Mobility               General bed mobility comments: Deferred d/t pt already up in chair and stayed in chair end of session.  Transfers Overall transfer level: Needs assistance Equipment used: Rolling walker (2 wheeled);None Transfers: Sit to/from Stand Sit to Stand: Supervision;Modified independent (Device/Increase time)         General transfer comment: SBA no AD; modified independent with RW  Ambulation/Gait Ambulation/Gait assistance: Supervision;Min guard Ambulation Distance (Feet):  (110 feet no AD; 110 feet UE support on IV pole; 50 feet with RW) Assistive device: Rolling walker (2 wheeled)   Gait velocity: mildly decreased   General Gait Details: pt mildly unsteady without AD (although CGA without loss of balance) but pt reaching for IV pole for steadiness with distance d/t fatigue and increasing unsteadiness; pt steady without loss of balance with RW (SBA)   Stairs             Wheelchair Mobility    Modified Rankin (Stroke Patients Only)       Balance Overall balance assessment: Needs assistance Sitting-balance support: No upper extremity supported;Feet supported Sitting balance-Leahy Scale: Normal     Standing balance support: Bilateral upper extremity supported (on RW) Standing balance-Leahy Scale: Good      Tinetti balance assessment:  25/28 (1/2 turning 360 degrees; 1/2 path; 0/1 walking time)                Cognition Arousal/Alertness: Awake/alert Behavior During Therapy: WFL for tasks assessed/performed Overall Cognitive Status: Within Functional Limits for tasks assessed                      Exercises Total Joint Exercises Ankle Circles/Pumps: AROM;Strengthening;Both;10 reps;Supine Short Arc Quad: AROM;Strengthening;Both;10 reps;Supine Heel Slides: AROM;Strengthening;Both;10 reps;Supine Hip ABduction/ADduction: AROM;Strengthening;Both;10 reps;Supine    General Comments General comments (skin integrity, edema, etc.): L LE swelling noted  Pt agreeable to PT session.  Pt reports she does not drive.      Pertinent Vitals/Pain Pain Assessment: No/denies pain  Vitals stable and WFL throughout treatment session.    Home Living                      Prior Function            PT Goals (current goals can now be found in the care plan section) Acute Rehab PT Goals Patient Stated  Goal: to get OOB and walk PT Goal Formulation: With patient Time For Goal Achievement: 01/04/16 Potential to Achieve Goals: Good Progress towards PT goals: Progressing toward goals    Frequency  Min 2X/week    PT Plan Discharge plan needs to be updated (CM notified)    Co-evaluation             End of Session Equipment Utilized During Treatment: Gait belt Activity Tolerance: Patient tolerated treatment well Patient left: in chair;with call bell/phone within reach;with chair alarm set (L LE elevated via  pillow)     Time: 1027-1050 PT Time Calculation (min) (ACUTE ONLY): 23 min  Charges:  $Gait Training: 8-22 mins $Therapeutic Exercise: 8-22 mins                    G CodesHendricks Limes 2016/01/02, 11:40 AM Hendricks Limes, PT [phone]

## 2015-12-25 LAB — CULTURE, BLOOD (ROUTINE X 2)
CULTURE: NO GROWTH
CULTURE: NO GROWTH

## 2015-12-26 NOTE — Discharge Planning (Deleted)
Ann Boone, is a 75 y.o. female  DOB 07/29/1941  MRN [id number].  Admission date:  12/20/2015  Admitting Physician  Ramonita Lab, MD  Discharge Date:  12/24/2015   Primary MD  No primary care provider on file.  Recommendations for primary care physician for things to follow:  Follow-up with primary doctor in 1 week  Admission Diagnosis  AKI (acute kidney injury) (HCC) [N17.9] Cellulitis of lower extremity, unspecified laterality [L03.119] Acute renal failure, unspecified acute renal failure type (HCC) [N17.9]   Discharge Diagnosis  AKI (acute kidney injury) (HCC) [N17.9] Cellulitis of lower extremity, unspecified laterality [L03.119] Acute renal failure, unspecified acute renal failure type (HCC) [N17.9]   Active Problems:   AKI (acute kidney injury) (HCC)      Past Medical History  Diagnosis Date  . Diabetes mellitus without complication (HCC)   . Hypertension   . Asthma   . CHF (congestive heart failure) (HCC)     History reviewed. No pertinent past surgical history.     History of present illness and  Hospital Course:     Kindly see H&P for history of present illness and admission details, please review complete Labs, Consult reports and Test reports for all details in brief  HPI  from the history and physical done on the day of admission Patient 75 year old female patient admitted for left leg cellulitis failed outpatient therapy with 3 different types of antibiotics namely Keflex, Bactrim, clindamycin. Patient was sent into the hospital because of abnormal labs with creatinine of 3.42. With acute on chronic renal failure.  Hospital Course  #1 acute on chronic renal failure secondary to medications: Improved with IV hydration, seen by nephrology, have chronic kidney disease stage III: Acute renal failure  secondary to infection and also medications including Bactrim.  Renal Ultrasound did not show any obstruction. And a urinalysis negative for SPEP and UPEP. Creatinine down to 1.09 with IV hydration, back to her baseline. Patient will follow up with nephrologist as an outpatient. Patient follows up with Dr.  Wynelle Link In [place]. #2 essential hypertension: The patient improved. Patient started back on benazepril.  #3 diabetes mellitus type 2: On glipizide. #4 left leg cellulitis:.Outpatient therapy, wound culture showed moderate growth of Klebsiella as per outpatient wound culture report. Patient received Rocephin while in the hospital, discharged home with Augmentin. #5 history of diabetic neuropathy #6 history of COPD: No wheezing. 1 deconditioning physical therapy evaluated home health physical therapy. #8 history of chronic systolic heart failure without any exacerbation this hospitalization. Lasix was held and ACE inhibitor for also were held because of renal failure. Patient Lasix is stopped at discharge. And separate also stop her discharge. Patient can Follow-up with nephrology, resume those medications as appropriate.    Discharge Condition:    Follow UP  Follow-up Information    Follow up with her Nephrologist.        Discharge Instructions  and  Discharge Medications     Discharge Instructions    Discharge instructions    Complete by:  As directed   Hold lasix and benazepril for 2 days and resume from monday     Face-to-face encounter (required for Medicare/Medicaid patients)    Complete by:  As directed   I Katha Hamming certify that this patient is under my care and that I, or a nurse practitioner or physician's assistant working with me, had a face-to-face encounter that meets the physician face-to-face encounter requirements with this patient on 12/24/2015. The encounter with the patient was  in whole, or in part for the following medical condition(s) which is the  primary reason for home health care  Right leg cellultis  The encounter with the patient was in whole, or in part, for the following medical condition, which is the primary reason for home health care:  whole  I certify that, based on my findings, the following services are medically necessary home health services:   Nursing Physical therapy    Reason for Medically Necessary Home Health Services:  Skilled Nursing- Skilled Assessment/Observation  My clinical findings support the need for the above services:  Shortness of breath with activity  Further, I certify that my clinical findings support that this patient is homebound due to:  Unsafe ambulation due to balance issues     Home Health    Complete by:  As directed   To provide the following care/treatments:   PT RN              Medication List    STOP taking these medications        aspirin EC 81 MG tablet     benazepril 20 MG tablet  Commonly known as:  LOTENSIN     clindamycin 300 MG capsule  Commonly known as:  CLEOCIN     furosemide 40 MG tablet  Commonly known as:  LASIX      TAKE these medications        allopurinol 300 MG tablet  Commonly known as:  ZYLOPRIM  Take 300 mg by mouth daily.     amoxicillin-clavulanate 875-125 MG tablet  Commonly known as:  AUGMENTIN  Take 1 tablet by mouth 2 (two) times daily.     carvedilol 12.5 MG tablet  Commonly known as:  COREG  Take 12.5 mg by mouth 2 (two) times daily with a meal.     cholecalciferol 1000 units tablet  Commonly known as:  VITAMIN D  Take 1,000 Units by mouth daily.     ferrous sulfate 325 (65 FE) MG tablet  Take 325 mg by mouth daily with breakfast.     fluticasone 50 MCG/ACT nasal spray  Commonly known as:  FLONASE  Place 1-2 sprays into both nostrils daily as needed for rhinitis.     Fluticasone-Salmeterol 250-50 MCG/DOSE Aepb  Commonly known as:  ADVAIR  Inhale 1 puff into the lungs 2 (two) times daily.     gabapentin 300 MG capsule   Commonly known as:  NEURONTIN  Take 300 mg by mouth 3 (three) times daily.     glipiZIDE 5 MG tablet  Commonly known as:  GLUCOTROL  Take 5 mg by mouth 2 (two) times daily before a meal.     ipratropium 17 MCG/ACT inhaler  Commonly known as:  ATROVENT HFA  Inhale 2 puffs into the lungs 2 (two) times daily.     lovastatin 40 MG tablet  Commonly known as:  MEVACOR  Take 40 mg by mouth at bedtime.     Melatonin 3 MG Tabs  Take 3 mg by mouth at bedtime.     montelukast 10 MG tablet  Commonly known as:  SINGULAIR  Take 10 mg by mouth at bedtime.     omeprazole 20 MG capsule  Commonly known as:  PRILOSEC  Take 20 mg by mouth daily.     potassium chloride 10 MEQ tablet  Commonly known as:  K-DUR,KLOR-CON  Take 10 mEq by mouth daily.     traMADol 50 MG tablet  Commonly known as:  ULTRAM  Take 50 mg by mouth every 6 (six) hours as needed for moderate pain.          Diet and Activity recommendation: See Discharge Instructions above   Consults obtained - nephrology   Major procedures and Radiology Reports - PLEASE review detailed and final reports for all details, in brief -      [place] Renal  12/21/2015  CLINICAL DATA:  Acute renal insufficiency. EXAM: RENAL / URINARY TRACT ULTRASOUND COMPLETE COMPARISON:  None. FINDINGS: Right Kidney: Length: 10.9 cm. Echogenicity within normal limits. No mass or hydronephrosis visualized. Left Kidney: Length: 11.2 cm. Echogenicity within normal limits. No mass or hydronephrosis visualized. Bladder: Appears normal for degree of bladder distention. IMPRESSION: Normal renal ultrasound. Electronically Signed   By: Ted Mcalpineobrinka  Dimitrova M.D.   On: 12/21/2015 12:11   [place] Venous Img Lower Unilateral Left  12/03/2015  CLINICAL DATA:  Lower extremity pain and swelling EXAM: LEFT LOWER EXTREMITY VENOUS DUPLEX ULTRASOUND TECHNIQUE: Doppler venous assessment of the left lower extremity deep venous system was performed, including characterization of  spectral flow, compressibility, and phasicity. COMPARISON:  None. FINDINGS: There is complete compressibility of the left common femoral, femoral, and popliteal veins. Doppler analysis demonstrates respiratory phasicity and augmentation of flow upon calf compression. Edema of the subcutaneous fat in the calf is noted. IMPRESSION: No evidence of left lower extremity DVT. Electronically Signed   By: Jolaine ClickArthur  Hoss M.D.   On: 12/03/2015 12:36    Micro Result   Recent Results (from the past 240 hour(s))  Blood Culture (routine x 2)     Status: None   Collection Time: 12/20/15  4:30 PM  Result Value Ref Range Status   Specimen Description BLOOD RIGHT ASSIST CONTROL  Final   Special Requests BOTTLES DRAWN AEROBIC AND ANAEROBIC  5CC  Final   Culture NO GROWTH 5 DAYS  Final   Report Status 12/25/2015 FINAL  Final  Blood Culture (routine x 2)     Status: None   Collection Time: 12/20/15  4:38 PM  Result Value Ref Range Status   Specimen Description BLOOD LEFT  Final   Special Requests BOTTLES DRAWN AEROBIC AND ANAEROBIC  1CC  Final   Culture NO GROWTH 5 DAYS  Final   Report Status 12/25/2015 FINAL  Final  Urine culture     Status: Abnormal   Collection Time: 12/21/15 11:43 AM  Result Value Ref Range Status   Specimen Description URINE, RANDOM  Final   Special Requests NONE  Final   Culture (A)  Final    4,000 COLONIES/mL INSIGNIFICANT GROWTH [facility]    Report Status 12/22/2015 FINAL  Final       Today   Subjective:   Ann KannerGracie Boone today has no headache,no chest abdominal pain,no new weakness tingling or numbness, feels much better wants to go home today.   Objective:   Blood pressure 166/65, pulse 68, temperature 98.4 F (36.9 C), temperature source Oral, resp. rate 16, height 5\' 6"  (1.676 m), weight 104.01 kg (229 lb 4.8 oz), SpO2 99 %.  No intake or output data in the 24 hours ending 12/26/15 1059  Exam Awake Alert, Oriented x 3, No new F.N  deficits, Normal affect [place].AT,PERRAL Supple Neck,No JVD, No cervical lymphadenopathy appriciated.  Symmetrical Chest wall movement, Good air movement bilaterally, CTAB RRR,No Gallops,Rubs or new Murmurs, No Parasternal Heave +ve B.Sounds, Abd Soft, Non tender, No organomegaly appriciated, No rebound -guarding or rigidity. No Cyanosis, Clubbing or edema,  No new Rash or bruise  Data Review   CBC w Diff:  Lab Results  Component Value Date   WBC 6.1 12/21/2015   HGB 9.4* 12/21/2015   HCT 30.3* 12/21/2015   PLT 138* 12/21/2015   LYMPHOPCT 15 12/20/2015   MONOPCT 7 12/20/2015   EOSPCT 4 12/20/2015   BASOPCT 1 12/20/2015    CMP:  Lab Results  Component Value Date   NA 141 12/23/2015   K 4.2 12/23/2015   CL 112* 12/23/2015   CO2 23 12/23/2015   BUN 20 12/23/2015   CREATININE 1.09* 12/24/2015   PROT 7.7 12/20/2015   ALBUMIN 4.1 12/20/2015   BILITOT 0.5 12/20/2015   ALKPHOS 84 12/20/2015   AST 45* 12/20/2015   ALT 34 12/20/2015  .   Total Time in preparing paper work, data evaluation and todays exam - 35 minutes  Abanoub Hanken M.D on 12/24/2015 at 10:59 AM    Note: This dictation was prepared with Dragon dictation along with smaller phrase technology. Any transcriptional errors that result from this process are unintentional.

## 2015-12-27 NOTE — Discharge Summary (Addendum)
Ann KannerGracie Ann Boone, is a 75 y.o. female  DOB 05/20/1941  MRN [id number].  Admission date:  12/20/2015  Admitting Physician  Ramonita LabAruna Gouru, MD  Discharge Date:  12/24/2015   Primary MD  No primary care provider on file.  Recommendations for primary care physician for things to follow:  Follow-up with primary doctor in 1 week  Admission Diagnosis  AKI (acute kidney injury) (HCC) [N17.9] Cellulitis of lower extremity, unspecified laterality [L03.119] Acute renal failure, unspecified acute renal failure type (HCC) [N17.9]   Discharge Diagnosis  AKI (acute kidney injury) (HCC) [N17.9] Cellulitis of lower extremity, unspecified laterality [L03.119] Acute renal failure, unspecified acute renal failure type (HCC) [N17.9]   Active Problems:   AKI (acute kidney injury) (HCC)      Past Medical History  Diagnosis Date  . Diabetes mellitus without complication (HCC)   . Hypertension   . Asthma   . CHF (congestive heart failure) (HCC)     History reviewed. No pertinent past surgical history.     History of present illness and  Hospital Course:     Kindly see H&P for history of present illness and admission details, please review complete Labs, Consult reports and Test reports for all details in brief  HPI  from the history and physical done on the day of admission Patient 75 year old female patient admitted for left leg cellulitis failed outpatient therapy with 3 different types of antibiotics namely Keflex, Bactrim, clindamycin. Patient was sent into the hospital because of abnormal labs with creatinine of 3.42. With acute on chronic renal failure.  Hospital Course  #1 acute on chronic renal failure secondary to medications: Improved with IV hydration, seen by nephrology, have chronic kidney disease stage III: Acute renal failure  secondary to infection and also medications including Bactrim.  Renal Ultrasound did not show any obstruction. And a urinalysis negative for SPEP and UPEP. Creatinine down to 1.09 with IV hydration, back to her baseline. Patient will follow up with nephrologist as an outpatient. Patient follows up with Dr.  Wynelle LinkKolluru In [place]. #2 essential hypertension: The patient improved. Patient started back on benazepril.  #3 diabetes mellitus type 2: On glipizide. #4 left leg cellulitis:.Outpatient therapy, wound culture showed moderate growth of Klebsiella as per outpatient wound culture report. Patient received Rocephin while in the hospital, discharged home with Augmentin. #5 history of diabetic neuropathy #6 history of COPD: No wheezing. 1 deconditioning physical therapy evaluated home health physical therapy. #8 history of chronic systolic heart failure without any exacerbation this hospitalization. Lasix was held and ACE inhibitor for also were held because of renal failure. Patient Lasix is stopped at discharge. And separate also stop her discharge. Patient can Follow-up with nephrology, resume those medications as appropriate.    Discharge Condition:    Follow UP  Follow-up Information    Follow up with her Nephrologist.        Discharge Instructions  and  Discharge Medications         Discharge Instructions    Discharge instructions    Complete by:  As directed   Hold lasix and benazepril for 2 days and resume from monday     Face-to-face encounter (required for Medicare/Medicaid patients)    Complete by:  As directed   I Katha HammingKONIDENA,Jalaya Sarver certify that this patient is under my care and that I, or a nurse practitioner or physician's assistant working with me, had a face-to-face encounter that meets the physician face-to-face encounter requirements with this patient on 12/24/2015. The encounter  with the patient was in whole, or in part for the following medical condition(s) which  is the primary reason for home health care  Right leg cellultis  The encounter with the patient was in whole, or in part, for the following medical condition, which is the primary reason for home health care:  whole  I certify that, based on my findings, the following services are medically necessary home health services:   Nursing Physical therapy    Reason for Medically Necessary Home Health Services:  Skilled Nursing- Skilled Assessment/Observation  My clinical findings support the need for the above services:  Shortness of breath with activity  Further, I certify that my clinical findings support that this patient is homebound due to:  Unsafe ambulation due to balance issues     Home Health    Complete by:  As directed   To provide the following care/treatments:   PT RN              Medication List    STOP taking these medications        aspirin EC 81 MG tablet     benazepril 20 MG tablet  Commonly known as:  LOTENSIN     clindamycin 300 MG capsule  Commonly known as:  CLEOCIN     furosemide 40 MG tablet  Commonly known as:  LASIX      TAKE these medications        allopurinol 300 MG tablet  Commonly known as:  ZYLOPRIM  Take 300 mg by mouth daily.     amoxicillin-clavulanate 875-125 MG tablet  Commonly known as:  AUGMENTIN  Take 1 tablet by mouth 2 (two) times daily.     carvedilol 12.5 MG tablet  Commonly known as:  COREG  Take 12.5 mg by mouth 2 (two) times daily with a meal.     cholecalciferol 1000 units tablet  Commonly known as:  VITAMIN D  Take 1,000 Units by mouth daily.     ferrous sulfate 325 (65 FE) MG tablet  Take 325 mg by mouth daily with breakfast.     fluticasone 50 MCG/ACT nasal spray  Commonly known as:  FLONASE  Place 1-2 sprays into both nostrils daily as needed for rhinitis.     Fluticasone-Salmeterol 250-50 MCG/DOSE Aepb  Commonly known as:  ADVAIR  Inhale 1 puff into the lungs 2 (two) times daily.     gabapentin 300 MG  capsule  Commonly known as:  NEURONTIN  Take 300 mg by mouth 3 (three) times daily.     glipiZIDE 5 MG tablet  Commonly known as:  GLUCOTROL  Take 5 mg by mouth 2 (two) times daily before a meal.     ipratropium 17 MCG/ACT inhaler  Commonly known as:  ATROVENT HFA  Inhale 2 puffs into the lungs 2 (two) times daily.     lovastatin 40 MG tablet  Commonly known as:  MEVACOR  Take 40 mg by mouth at bedtime.     Melatonin 3 MG Tabs  Take 3 mg by mouth at bedtime.     montelukast 10 MG tablet  Commonly known as:  SINGULAIR  Take 10 mg by mouth at bedtime.     omeprazole 20 MG capsule  Commonly known as:  PRILOSEC  Take 20 mg by mouth daily.     potassium chloride 10 MEQ tablet  Commonly known as:  K-DUR,KLOR-CON  Take 10 mEq by mouth daily.     traMADol 50 MG tablet  Commonly known as:  ULTRAM  Take 50 mg by mouth every 6 (six) hours as needed for moderate pain.          Diet and Activity recommendation: See Discharge Instructions above   Consults obtained - nephrology   Major procedures and Radiology Reports - PLEASE review detailed and final reports for all details, in brief -      [place] Renal  12/21/2015  CLINICAL DATA:  Acute renal insufficiency. EXAM: RENAL / URINARY TRACT ULTRASOUND COMPLETE COMPARISON:  None. FINDINGS: Right Kidney: Length: 10.9 cm. Echogenicity within normal limits. No mass or hydronephrosis visualized. Left Kidney: Length: 11.2 cm. Echogenicity within normal limits. No mass or hydronephrosis visualized. Bladder: Appears normal for degree of bladder distention. IMPRESSION: Normal renal ultrasound. Electronically Signed   By: Ted Mcalpine M.D.   On: 12/21/2015 12:11   [place] Venous Img Lower Unilateral Left  12/03/2015  CLINICAL DATA:  Lower extremity pain and swelling EXAM: LEFT LOWER EXTREMITY VENOUS DUPLEX ULTRASOUND TECHNIQUE: Doppler venous assessment of the left lower extremity deep venous system was performed, including characterization  of spectral flow, compressibility, and phasicity. COMPARISON:  None. FINDINGS: There is complete compressibility of the left common femoral, femoral, and popliteal veins. Doppler analysis demonstrates respiratory phasicity and augmentation of flow upon calf compression. Edema of the subcutaneous fat in the calf is noted. IMPRESSION: No evidence of left lower extremity DVT. Electronically Signed   By: Jolaine Click M.D.   On: 12/03/2015 12:36    Micro Result   Recent Results (from the past 240 hour(s))  Blood Culture (routine x 2)     Status: None   Collection Time: 12/20/15  4:30 PM  Result Value Ref Range Status   Specimen Description BLOOD RIGHT ASSIST CONTROL  Final   Special Requests BOTTLES DRAWN AEROBIC AND ANAEROBIC  5CC  Final   Culture NO GROWTH 5 DAYS  Final   Report Status 12/25/2015 FINAL  Final  Blood Culture (routine x 2)     Status: None   Collection Time: 12/20/15  4:38 PM  Result Value Ref Range Status   Specimen Description BLOOD LEFT  Final   Special Requests BOTTLES DRAWN AEROBIC AND ANAEROBIC  1CC  Final   Culture NO GROWTH 5 DAYS  Final   Report Status 12/25/2015 FINAL  Final  Urine culture     Status: Abnormal   Collection Time: 12/21/15 11:43 AM  Result Value Ref Range Status   Specimen Description URINE, RANDOM  Final   Special Requests NONE  Final   Culture (A)  Final    4,000 COLONIES/mL INSIGNIFICANT GROWTH Performed at [facility]    Report Status 12/22/2015 FINAL  Final       Today   Subjective:   Ann Boone today has no headache,no chest abdominal pain,no new weakness tingling or numbness, feels much better wants to go home today.   Objective:   Blood pressure 166/65, pulse 68, temperature 98.4 F (36.9 C), temperature source Oral, resp. rate 16, height 5\' 6"  (1.676 m), weight 104.01 kg (229 lb 4.8 oz), SpO2 99 %.  No intake or output data in the 24 hours ending 12/27/15 1352  Exam Awake Alert, Oriented x 3, No new F.N  deficits, Normal affect [place].AT,PERRAL Supple Neck,No JVD, No cervical lymphadenopathy appriciated.  Symmetrical Chest wall movement, Good air movement bilaterally, CTAB RRR,No Gallops,Rubs or new Murmurs, No Parasternal Heave +ve B.Sounds, Abd Soft, Non tender, No organomegaly appriciated, No rebound -guarding or rigidity. No  Cyanosis, Clubbing or edema, No new Rash or bruise  Data Review   CBC w Diff:  Lab Results  Component Value Date   WBC 6.1 12/21/2015   HGB 9.4* 12/21/2015   HCT 30.3* 12/21/2015   PLT 138* 12/21/2015   LYMPHOPCT 15 12/20/2015   MONOPCT 7 12/20/2015   EOSPCT 4 12/20/2015   BASOPCT 1 12/20/2015    CMP:  Lab Results  Component Value Date   NA 141 12/23/2015   K 4.2 12/23/2015   CL 112* 12/23/2015   CO2 23 12/23/2015   BUN 20 12/23/2015   CREATININE 1.09* 12/24/2015   PROT 7.7 12/20/2015   ALBUMIN 4.1 12/20/2015   BILITOT 0.5 12/20/2015   ALKPHOS 84 12/20/2015   AST 45* 12/20/2015   ALT 34 12/20/2015  .   Total Time in preparing paper work, data evaluation and todays exam - 35 minutes  Cheyenne Bordeaux M.D on 12/24/2015 at 1:52 PM    Note: This dictation was prepared with Dragon dictation along with smaller phrase technology. Any transcriptional errors that result from this process are unintentional.

## 2016-02-18 ENCOUNTER — Other Ambulatory Visit (HOSPITAL_COMMUNITY): Payer: Self-pay | Admitting: Emergency Medicine

## 2016-02-18 ENCOUNTER — Other Ambulatory Visit: Payer: Self-pay | Admitting: Emergency Medicine

## 2016-02-18 DIAGNOSIS — M79604 Pain in right leg: Secondary | ICD-10-CM

## 2016-02-21 ENCOUNTER — Ambulatory Visit (HOSPITAL_COMMUNITY)
Admission: RE | Admit: 2016-02-21 | Discharge: 2016-02-21 | Disposition: A | Discharge status: Home / Self Care | Payer: Medicare Other | Source: Ambulatory Visit | Attending: Emergency Medicine | Admitting: Emergency Medicine

## 2016-02-21 DIAGNOSIS — M7989 Other specified soft tissue disorders: Secondary | ICD-10-CM | POA: Insufficient documentation

## 2016-02-21 DIAGNOSIS — R590 Localized enlarged lymph nodes: Secondary | ICD-10-CM | POA: Insufficient documentation

## 2016-02-21 DIAGNOSIS — M79604 Pain in right leg: Secondary | ICD-10-CM

## 2016-02-25 ENCOUNTER — Other Ambulatory Visit (HOSPITAL_COMMUNITY): Payer: Self-pay | Admitting: Emergency Medicine

## 2016-02-25 DIAGNOSIS — R590 Localized enlarged lymph nodes: Secondary | ICD-10-CM

## 2016-03-13 ENCOUNTER — Ambulatory Visit (HOSPITAL_COMMUNITY)
Admission: RE | Admit: 2016-03-13 | Discharge: 2016-03-13 | Disposition: A | Discharge status: Home / Self Care | Payer: Medicare Other | Source: Ambulatory Visit | Attending: Emergency Medicine | Admitting: Emergency Medicine

## 2016-03-13 DIAGNOSIS — K5730 Diverticulosis of large intestine without perforation or abscess without bleeding: Secondary | ICD-10-CM | POA: Insufficient documentation

## 2016-03-13 DIAGNOSIS — R599 Enlarged lymph nodes, unspecified: Secondary | ICD-10-CM | POA: Diagnosis present

## 2016-03-13 DIAGNOSIS — R590 Localized enlarged lymph nodes: Secondary | ICD-10-CM

## 2016-03-13 LAB — POCT I-STAT CREATININE: Creatinine, Ser: 1.2 mg/dL — ABNORMAL HIGH (ref 0.44–1.00)

## 2016-03-13 MED ORDER — IOPAMIDOL (ISOVUE-300) INJECTION 61%
100.0000 mL | Freq: Once | INTRAVENOUS | Status: AC | PRN
  Administered 2016-03-13: 100 mL via INTRAVENOUS

## 2016-03-27 ENCOUNTER — Ambulatory Visit (INDEPENDENT_AMBULATORY_CARE_PROVIDER_SITE_OTHER): Payer: Medicare Other | Admitting: Internal Medicine

## 2016-03-27 ENCOUNTER — Encounter: Payer: Self-pay | Admitting: Internal Medicine

## 2016-03-27 ENCOUNTER — Encounter (INDEPENDENT_AMBULATORY_CARE_PROVIDER_SITE_OTHER): Payer: Self-pay

## 2016-03-27 VITALS — BP 148/70 | HR 61 | Ht 67.0 in | Wt 208.0 lb

## 2016-03-27 DIAGNOSIS — R609 Edema, unspecified: Secondary | ICD-10-CM | POA: Diagnosis not present

## 2016-03-27 DIAGNOSIS — M7989 Other specified soft tissue disorders: Secondary | ICD-10-CM

## 2016-03-27 DIAGNOSIS — R600 Localized edema: Secondary | ICD-10-CM

## 2016-03-27 LAB — COMPREHENSIVE METABOLIC PANEL
ALK PHOS: 59 U/L (ref 33–130)
ALT: 8 U/L (ref 6–29)
AST: 12 U/L (ref 10–35)
Albumin: 4.2 g/dL (ref 3.6–5.1)
BUN: 14 mg/dL (ref 7–25)
CALCIUM: 9.2 mg/dL (ref 8.6–10.4)
CO2: 26 mmol/L (ref 20–31)
Chloride: 107 mmol/L (ref 98–110)
Creat: 1.19 mg/dL — ABNORMAL HIGH (ref 0.60–0.93)
GLUCOSE: 93 mg/dL (ref 65–99)
POTASSIUM: 4.3 mmol/L (ref 3.5–5.3)
Sodium: 144 mmol/L (ref 135–146)
Total Bilirubin: 0.3 mg/dL (ref 0.2–1.2)
Total Protein: 7.1 g/dL (ref 6.1–8.1)

## 2016-03-27 LAB — CBC
HEMATOCRIT: 33.6 % — AB (ref 35.0–45.0)
Hemoglobin: 10 g/dL — ABNORMAL LOW (ref 11.7–15.5)
MCH: 24.4 pg — AB (ref 27.0–33.0)
MCHC: 29.8 g/dL — AB (ref 32.0–36.0)
MCV: 82 fL (ref 80.0–100.0)
Platelets: 235 10*3/uL (ref 140–400)
RBC: 4.1 MIL/uL (ref 3.80–5.10)
RDW: 18.5 % — AB (ref 11.0–15.0)
WBC: 5.8 10*3/uL (ref 3.8–10.8)

## 2016-03-27 LAB — TSH: TSH: 5.66 mIU/L — ABNORMAL HIGH

## 2016-03-27 NOTE — Progress Notes (Signed)
Cardiology Office Note   Date:  03/27/2016   ID:  Ann Boone, DOB September 16, 1940, MRN [id number]  PCP:  Alain Honey, FNP  Cardiologist:   Dietrich Pates, MD    Pt referred for eval of edema     History of Present Illness: Ann Boone is a 75 y.o. female with a history of LE edema  Followed by Helene Shoe FNP    Told she had CHF a long time ago  Testing never done   Recently had increase in LE edema  Seen in medicine clinic  Aldactone added with some improvement  Still with edema Denies CP  Breathing is better  Not that activit Says she watches salt       Outpatient Medications Prior to Visit  Medication Sig Dispense Refill  . allopurinol (ZYLOPRIM) 300 MG tablet Take 300 mg by mouth daily.    Marland Kitchen amoxicillin-clavulanate (AUGMENTIN) 875-125 MG tablet Take 1 tablet by mouth 2 (two) times daily. 20 tablet 0  . carvedilol (COREG) 12.5 MG tablet Take 12.5 mg by mouth 2 (two) times daily with a meal.    . cholecalciferol (VITAMIN D) 1000 units tablet Take 1,000 Units by mouth daily.    . ferrous sulfate 325 (65 FE) MG tablet Take 325 mg by mouth daily with breakfast.    . fluticasone (FLONASE) 50 MCG/ACT nasal spray Place 1-2 sprays into both nostrils daily as needed for rhinitis.    . Fluticasone-Salmeterol (ADVAIR) 250-50 MCG/DOSE AEPB Inhale 1 puff into the lungs 2 (two) times daily.    Marland Kitchen gabapentin (NEURONTIN) 300 MG capsule Take 300 mg by mouth 3 (three) times daily.    Marland Kitchen glipiZIDE (GLUCOTROL) 5 MG tablet Take 5 mg by mouth 2 (two) times daily before a meal.    . ipratropium (ATROVENT HFA) 17 MCG/ACT inhaler Inhale 2 puffs into the lungs 2 (two) times daily.    Marland Kitchen lovastatin (MEVACOR) 40 MG tablet Take 40 mg by mouth at bedtime.    . Melatonin 3 MG TABS Take 3 mg by mouth at bedtime.    . montelukast (SINGULAIR) 10 MG tablet Take 10 mg by mouth at bedtime.    Marland Kitchen omeprazole (PRILOSEC) 20 MG capsule Take 20 mg by mouth daily.    . potassium chloride (K-DUR,KLOR-CON) 10 MEQ tablet  Take 10 mEq by mouth daily.    . traMADol (ULTRAM) 50 MG tablet Take 50 mg by mouth every 6 (six) hours as needed for moderate pain.     No facility-administered medications prior to visit.      Allergies:   Review of patient's allergies indicates no known allergies.   Past Medical History:  Diagnosis Date  . Asthma   . CHF (congestive heart failure) (HCC)   . COPD (chronic obstructive pulmonary disease) (HCC) 05/18/2015  . Diabetes mellitus without complication (HCC)   . Hypertension   . Mixed hyperlipidemia     Past Surgical History:  Procedure Laterality Date  . episiotomy repair       Social History:  The patient  reports that she has quit smoking. Her smoking use included Cigarettes. She has never used smokeless tobacco. She reports that she does not drink alcohol or use drugs.   Family History:  The patient's family history includes Alcoholism in her brother; Cancer in her sister; Diabetes in her sister; Glaucoma in her brother and father; Heart disease in her father, maternal grandfather, and sister; Hypertension in her brother and mother; Kidney disease in her mother.  ROS:  Please see the history of present illness. All other systems are reviewed and  Negative to the above problem except as noted.    PHYSICAL EXAM: VS:  BP (!) 148/70 (BP Location: Right Arm, Patient Position: Sitting, Cuff Size: Normal)   Pulse 61   Ht 5\' 7"  (1.702 m)   Wt 208 lb (94.3 kg)   SpO2 98%   BMI 32.58 kg/m   GEN: Obese 75 yo, in no acute distress HEENT: normal Neck: JVP is mildly increased  , carotid bruits, or masses Cardiac: RRR; no murmurs, rubs, or gallops,2+ edema  Respiratory:  clear to auscultation bilaterally, normal work of breathing GI: soft, nontender, nondistended, + BS  No hepatomegaly  MS: no deformity Moving all extremities   Skin: warm and dry, no rash Neuro:  Strength and sensation are intact Psych: euthymic mood, full affect   EKG:  EKG is ordered today.   Sinusb bradycardia  55 bpm with PVCs     Lipid Panel No results found for: CHOL, TRIG, HDL, CHOLHDL, VLDL, LDLCALC, LDLDIRECT    Wt Readings from Last 3 Encounters:  03/27/16 208 lb (94.3 kg)  02/25/16 211 lb 12.8 oz (96.1 kg)  12/24/15 229 lb 4.8 oz (104 kg)      ASSESSMENT AND PLAN:  1  Edema  Pt with increased volume on exam  Will set up for echo as well as labs  Keep on same meds for now  Watch NA  Will be in touch with pt re results  Changes in meds   F/U tentatively in 1 month to reassess       Signed, Dietrich PatesPaula Roshni Burbano, MD  03/27/2016 9:45 AM    [facility] [address], [place], [place]  [address] Phone: [phone]; Fax: [phone]

## 2016-03-27 NOTE — Patient Instructions (Signed)
Your physician recommends that you schedule a follow-up appointment in: 4 weeks     Your physician has requested that you have an echocardiogram. Echocardiography is a painless test that uses sound waves to create images of your heart. It provides your doctor with information about the size and shape of your heart and how well your heart's chambers and valves are working. This procedure takes approximately one hour. There are no restrictions for this procedure.      Get Lab work at [facility] lab across the street      Thank you for choosing [facility] !

## 2016-03-28 ENCOUNTER — Telehealth: Payer: Self-pay

## 2016-03-28 DIAGNOSIS — E8770 Fluid overload, unspecified: Secondary | ICD-10-CM

## 2016-03-28 LAB — BRAIN NATRIURETIC PEPTIDE: Brain Natriuretic Peptide: 318.2 pg/mL — ABNORMAL HIGH (ref <100)

## 2016-03-28 MED ORDER — FUROSEMIDE 40 MG PO TABS: 40.0000 mg | ORAL_TABLET | Freq: Every day | ORAL | 3 refills | Status: DC

## 2016-03-28 MED ORDER — POTASSIUM CHLORIDE ER 10 MEQ PO TBCR: 10.0000 meq | EXTENDED_RELEASE_TABLET | Freq: Every day | ORAL | 3 refills | Status: DC

## 2016-03-28 NOTE — Telephone Encounter (Signed)
-----   Message from Pricilla RifflePaula V Ross, MD sent at 03/28/2016 10:28 AM EDT ----- Pt kidney function and electrolytes are OK Hgb is stable  She is mildly anemic Need to check thyrod function with extra tests  I would recomm lasix 40 with 10 meq KCL per day to help decreased fluid. In 10 days check BMET, BNP, free T3, fee T4

## 2016-03-28 NOTE — Telephone Encounter (Signed)
Results given to pt,e-scribed meds,mailed lab slip

## 2016-03-30 ENCOUNTER — Telehealth: Payer: Self-pay | Admitting: Internal Medicine

## 2016-03-30 NOTE — Telephone Encounter (Signed)
Return[facility]uvy [facility] calling [facility] EF.Advised 03/27/16 echo results not available.

## 2016-03-30 NOTE — Telephone Encounter (Signed)
New message   [facility] RN  Calling for the ejection fraction form the last office visit and the last office notes  Pt is enrolled int he heart failure program of [facility]   Fax number [phone]

## 2016-04-07 ENCOUNTER — Encounter: Payer: Self-pay | Admitting: Emergency Medicine

## 2016-04-07 ENCOUNTER — Emergency Department: Payer: Medicare Other

## 2016-04-07 ENCOUNTER — Emergency Department
Admission: EM | Admit: 2016-04-07 | Discharge: 2016-04-07 | Disposition: A | Discharge status: Home / Self Care | Payer: Medicare Other | Attending: Emergency Medicine | Admitting: Emergency Medicine

## 2016-04-07 DIAGNOSIS — R600 Localized edema: Secondary | ICD-10-CM | POA: Diagnosis not present

## 2016-04-07 DIAGNOSIS — Z7984 Long term (current) use of oral hypoglycemic drugs: Secondary | ICD-10-CM | POA: Diagnosis not present

## 2016-04-07 DIAGNOSIS — Z79899 Other long term (current) drug therapy: Secondary | ICD-10-CM | POA: Diagnosis not present

## 2016-04-07 DIAGNOSIS — J449 Chronic obstructive pulmonary disease, unspecified: Secondary | ICD-10-CM | POA: Insufficient documentation

## 2016-04-07 DIAGNOSIS — J45909 Unspecified asthma, uncomplicated: Secondary | ICD-10-CM | POA: Insufficient documentation

## 2016-04-07 DIAGNOSIS — I509 Heart failure, unspecified: Secondary | ICD-10-CM | POA: Diagnosis not present

## 2016-04-07 DIAGNOSIS — E119 Type 2 diabetes mellitus without complications: Secondary | ICD-10-CM | POA: Diagnosis not present

## 2016-04-07 DIAGNOSIS — Z87891 Personal history of nicotine dependence: Secondary | ICD-10-CM | POA: Insufficient documentation

## 2016-04-07 DIAGNOSIS — M7989 Other specified soft tissue disorders: Secondary | ICD-10-CM | POA: Diagnosis present

## 2016-04-07 DIAGNOSIS — I110 Hypertensive heart disease with heart failure: Secondary | ICD-10-CM | POA: Insufficient documentation

## 2016-04-07 LAB — BASIC METABOLIC PANEL WITH GFR
Anion gap: 3 — ABNORMAL LOW (ref 5–15)
BUN: 24 mg/dL — ABNORMAL HIGH (ref 6–20)
CO2: 33 mmol/L — ABNORMAL HIGH (ref 22–32)
Calcium: 9.5 mg/dL (ref 8.9–10.3)
Chloride: 106 mmol/L (ref 101–111)
Creatinine, Ser: 1.21 mg/dL — ABNORMAL HIGH (ref 0.44–1.00)
GFR calc Af Amer: 50 mL/min — ABNORMAL LOW
GFR calc non Af Amer: 43 mL/min — ABNORMAL LOW
Glucose, Bld: 99 mg/dL (ref 65–99)
Potassium: 3.8 mmol/L (ref 3.5–5.1)
Sodium: 142 mmol/L (ref 135–145)

## 2016-04-07 LAB — CBC
HEMATOCRIT: 33.8 % — AB (ref 35.0–47.0)
HEMOGLOBIN: 10.8 g/dL — AB (ref 12.0–16.0)
MCH: 25.9 pg — ABNORMAL LOW (ref 26.0–34.0)
MCHC: 31.9 g/dL — ABNORMAL LOW (ref 32.0–36.0)
MCV: 81.2 fL (ref 80.0–100.0)
Platelets: 155 10*3/uL (ref 150–440)
RBC: 4.16 MIL/uL (ref 3.80–5.20)
RDW: 16 % — ABNORMAL HIGH (ref 11.5–14.5)
WBC: 4.6 10*3/uL (ref 3.6–11.0)

## 2016-04-07 NOTE — ED Triage Notes (Signed)
Developed left lower leg swelling about 1 week ago  Now drainage noted from ankle

## 2016-04-07 NOTE — ED Provider Notes (Signed)
[facility] Emergency Department Provider Note   ____________________________________________    I have reviewed the triage vital signs and the nursing notes.   HISTORY  Chief Complaint Leg Swelling     HPI Ann Boone is a 75 y.o. female who presents with complaints of leg swelling. Patient has a history of chronic lower extremity edema. She became to concerned because she noticed some clear fluid discharge near her ankle on the left. She denies pain. She denies fevers. She does have history of diabetes. She denies recent travel. She reports she had a normal ultrasound four or 5 months ago. No shortness of breath or pleurisy.   Past Medical History:  Diagnosis Date  . Asthma   . CHF (congestive heart failure) (HCC)   . COPD (chronic obstructive pulmonary disease) (HCC) 05/18/2015  . Diabetes mellitus without complication (HCC)   . Hypertension   . Mixed hyperlipidemia     Patient Active Problem List   Diagnosis Date Noted  . AKI (acute kidney injury) (HCC) 12/20/2015    Past Surgical History:  Procedure Laterality Date  . episiotomy repair      Prior to Admission medications   Medication Sig Start Date End Date Taking? Authorizing Provider  allopurinol (ZYLOPRIM) 300 MG tablet Take 300 mg by mouth daily.   Yes Historical Provider, MD  benazepril (LOTENSIN) 20 MG tablet Take 20 mg by mouth daily.   Yes Historical Provider, MD  carvedilol (COREG) 12.5 MG tablet Take 12.5 mg by mouth 2 (two) times daily with a meal.   Yes Historical Provider, MD  cetirizine (ZYRTEC) 10 MG tablet Take 10 mg by mouth at bedtime.   Yes Historical Provider, MD  colchicine 0.6 MG tablet Take 0.6 mg by mouth 2 (two) times daily as needed. For gout flare-ups   Yes Historical Provider, MD  ferrous sulfate 325 (65 FE) MG tablet Take 325 mg by mouth every evening.    Yes Historical Provider, MD  fluticasone (FLONASE) 50 MCG/ACT nasal spray Place 1-2 sprays into  both nostrils daily as needed for rhinitis.   Yes Historical Provider, MD  Fluticasone-Salmeterol (ADVAIR) 250-50 MCG/DOSE AEPB Inhale 1 puff into the lungs 2 (two) times daily.   Yes Historical Provider, MD  furosemide (LASIX) 40 MG tablet Take 1 tablet (40 mg total) by mouth daily. 03/28/16  Yes Pricilla Riffle, MD  gabapentin (NEURONTIN) 300 MG capsule Take 300 mg by mouth 3 (three) times daily.   Yes Historical Provider, MD  glipiZIDE (GLUCOTROL) 5 MG tablet Take 5 mg by mouth 2 (two) times daily.   Yes Historical Provider, MD  ipratropium (ATROVENT HFA) 17 MCG/ACT inhaler Inhale 2 puffs into the lungs 2 (two) times daily.   Yes Historical Provider, MD  lovastatin (MEVACOR) 40 MG tablet Take 40 mg by mouth at bedtime.   Yes Historical Provider, MD  Melatonin 3 MG TABS Take 3 mg by mouth at bedtime as needed. For sleep   Yes Historical Provider, MD  montelukast (SINGULAIR) 10 MG tablet Take 10 mg by mouth at bedtime.   Yes Historical Provider, MD  omeprazole (PRILOSEC) 20 MG capsule Take 20 mg by mouth daily.   Yes Historical Provider, MD  potassium chloride (K-DUR) 10 MEQ tablet Take 1 tablet (10 mEq total) by mouth daily. 03/28/16  Yes Pricilla Riffle, MD  traMADol (ULTRAM) 50 MG tablet Take 50 mg by mouth every 6 (six) hours as needed for moderate pain.   Yes Historical Provider,  MD  Vitamin D, Ergocalciferol, (DRISDOL) 50000 units CAPS capsule Take 50,000 Units by mouth every 30 (thirty) days.   Yes Historical Provider, MD  amoxicillin-clavulanate (AUGMENTIN) 875-125 MG tablet Take 1 tablet by mouth 2 (two) times daily. Patient not taking: Reported on 04/07/2016 12/24/15   Katha HammingSnehalatha Konidena, MD     Allergies Review of patient's allergies indicates no known allergies.  Family History  Problem Relation Age of Onset  . Kidney disease Mother   . Hypertension Mother   . Heart disease Father   . Glaucoma Father   . Cancer Sister   . Diabetes Sister   . Heart disease Sister   . Glaucoma Brother     . Alcoholism Brother   . Hypertension Brother   . Heart disease Maternal Grandfather     Social History Social History  Substance Use Topics  . Smoking status: Former Smoker    Types: Cigarettes  . Smokeless tobacco: Never Used  . Alcohol use No    Review of Systems  Constitutional: No fever/chills  Cardiovascular: Denies chest pain. Respiratory: Denies shortness of breath. Gastrointestinal: No abdominal pain.  No nausea, no vomiting.    Musculoskeletal: Leg swelling as above Skin: Negative for rash or redness   10-point ROS otherwise negative.  ____________________________________________   PHYSICAL EXAM:  VITAL SIGNS: ED Triage Vitals  Enc Vitals Group     BP 04/07/16 0910 (!) 185/65     Pulse Rate 04/07/16 0910 60     Resp 04/07/16 0910 20     Temp 04/07/16 0910 97.8 F (36.6 C)     Temp Source 04/07/16 0910 Oral     SpO2 04/07/16 0910 99 %     Weight 04/07/16 0908 208 lb (94.3 kg)     Height 04/07/16 0908 5\' 4"  (1.626 m)     Head Circumference --      Peak Flow --      Pain Score --      Pain Loc --      Pain Edu? --      Excl. in GC? --     Constitutional: Alert and oriented. No acute distress. Pleasant and interactive Eyes: Conjunctivae are normal.  Head: Atraumatic. Nose: No congestion/rhinnorhea. Mouth/Throat: Mucous membranes are moist.    Cardiovascular: Normal rate, regular rhythm. Grossly normal heart sounds.  Good peripheral circulation. Respiratory: Normal respiratory effort.  No retractions. Lungs CTAB. Gastrointestinal: Soft and nontender. No distention.  No CVA tenderness. Genitourinary: deferred Musculoskeletal: Both lower extremity is warm and well-perfused. 1+ edema to the level of the knee bilaterally. Chronic skin changes noted. No evidence of cellulitis. 2+ distal pulses. Small amount of clearish fluid weeping from left lateral ankle area. No fluctuance or abscess noted. No calf tenderness palpation. Neurologic:  Normal speech  and language. No gross focal neurologic deficits are appreciated.  Skin:  Skin is warm, see above Psychiatric: Mood and affect are normal. Speech and behavior are normal.  ____________________________________________   LABS (all labs ordered are listed, but only abnormal results are displayed)  Labs Reviewed  BASIC METABOLIC PANEL - Abnormal; Notable for the following:       Result Value   CO2 33 (*)    BUN 24 (*)    Creatinine, Ser 1.21 (*)    GFR calc non Af Amer 43 (*)    GFR calc Af Amer 50 (*)    Anion gap 3 (*)    All other components within normal limits  CBC - Abnormal;  Notable for the following:    Hemoglobin 10.8 (*)    HCT 33.8 (*)    MCH 25.9 (*)    MCHC 31.9 (*)    RDW 16.0 (*)    All other components within normal limits   ____________________________________________  EKG  None ____________________________________________  RADIOLOGY  Ultrasound normal ____________________________________________   PROCEDURES  Procedure(s) performed: No    Critical Care performed: No ____________________________________________   INITIAL IMPRESSION / ASSESSMENT AND PLAN / ED COURSE  Pertinent labs & imaging results that were available during my care of the patient were reviewed by me and considered in my medical decision making (see chart for details).  Patient with clear fluid weeping from small area around the left ankle, suspect this is from chronic edema but she is at risk for DVT so we will obtain left leg ultrasound and check labs as well.    Clinical Course  Ultrasound normal, no evidence of infection. Blood work reassuring. Okay for outpatient follow-up. ____________________________________________   FINAL CLINICAL IMPRESSION(S) / ED DIAGNOSES  Final diagnoses:  Bilateral lower extremity edema      NEW MEDICATIONS STARTED DURING THIS VISIT:  Discharge Medication List as of 04/07/2016 10:58 AM       Note:  This document was prepared  using Dragon voice recognition software and may include unintentional dictation errors.    Jene Every, MD 04/07/16 [phone]

## 2016-04-10 ENCOUNTER — Ambulatory Visit (HOSPITAL_COMMUNITY)
Admission: RE | Admit: 2016-04-10 | Discharge: 2016-04-10 | Disposition: A | Discharge status: Home / Self Care | Payer: Medicare Other | Source: Ambulatory Visit | Attending: Internal Medicine | Admitting: Internal Medicine

## 2016-04-10 DIAGNOSIS — E785 Hyperlipidemia, unspecified: Secondary | ICD-10-CM | POA: Insufficient documentation

## 2016-04-10 DIAGNOSIS — I509 Heart failure, unspecified: Secondary | ICD-10-CM | POA: Diagnosis not present

## 2016-04-10 DIAGNOSIS — I110 Hypertensive heart disease with heart failure: Secondary | ICD-10-CM | POA: Insufficient documentation

## 2016-04-10 DIAGNOSIS — I10 Essential (primary) hypertension: Secondary | ICD-10-CM | POA: Diagnosis not present

## 2016-04-10 DIAGNOSIS — J449 Chronic obstructive pulmonary disease, unspecified: Secondary | ICD-10-CM | POA: Insufficient documentation

## 2016-04-10 DIAGNOSIS — Z87891 Personal history of nicotine dependence: Secondary | ICD-10-CM | POA: Diagnosis not present

## 2016-04-10 DIAGNOSIS — I340 Nonrheumatic mitral (valve) insufficiency: Secondary | ICD-10-CM | POA: Diagnosis not present

## 2016-04-10 DIAGNOSIS — R609 Edema, unspecified: Secondary | ICD-10-CM | POA: Insufficient documentation

## 2016-04-10 DIAGNOSIS — R600 Localized edema: Secondary | ICD-10-CM

## 2016-04-10 DIAGNOSIS — E119 Type 2 diabetes mellitus without complications: Secondary | ICD-10-CM | POA: Diagnosis not present

## 2016-04-10 NOTE — Progress Notes (Signed)
*  PRELIMINARY RESULTS* Echocardiogram 2D Echocardiogram has been performed.  Jeryl Columbialliott, Trevin Gartrell 04/10/2016, 9:10 AM

## 2016-04-13 LAB — BASIC METABOLIC PANEL
BUN: 12 mg/dL (ref 7–25)
CALCIUM: 9.6 mg/dL (ref 8.6–10.4)
CHLORIDE: 104 mmol/L (ref 98–110)
CO2: 28 mmol/L (ref 20–31)
CREATININE: 1.17 mg/dL — AB (ref 0.60–0.93)
Glucose, Bld: 64 mg/dL — ABNORMAL LOW (ref 65–99)
Potassium: 4.2 mmol/L (ref 3.5–5.3)
Sodium: 141 mmol/L (ref 135–146)

## 2016-04-13 LAB — T4, FREE: FREE T4: 1.1 ng/dL (ref 0.8–1.8)

## 2016-04-13 LAB — BRAIN NATRIURETIC PEPTIDE: Brain Natriuretic Peptide: 21.1 pg/mL (ref <100)

## 2016-04-13 LAB — T3, FREE: T3 FREE: 2.9 pg/mL (ref 2.3–4.2)

## 2016-04-26 ENCOUNTER — Ambulatory Visit (INDEPENDENT_AMBULATORY_CARE_PROVIDER_SITE_OTHER): Payer: Medicare Other | Admitting: Physician Assistant

## 2016-04-26 ENCOUNTER — Encounter: Payer: Self-pay | Admitting: *Deleted

## 2016-04-26 ENCOUNTER — Encounter: Payer: Self-pay | Admitting: Physician Assistant

## 2016-04-26 VITALS — BP 136/54 | HR 59 | Ht 66.0 in | Wt 195.0 lb

## 2016-04-26 DIAGNOSIS — R002 Palpitations: Secondary | ICD-10-CM

## 2016-04-26 DIAGNOSIS — I5032 Chronic diastolic (congestive) heart failure: Secondary | ICD-10-CM

## 2016-04-26 DIAGNOSIS — M7989 Other specified soft tissue disorders: Secondary | ICD-10-CM

## 2016-04-26 LAB — BASIC METABOLIC PANEL
BUN: 25 mg/dL (ref 7–25)
CO2: 28 mmol/L (ref 20–31)
CREATININE: 1.34 mg/dL — AB (ref 0.60–0.93)
Calcium: 9.6 mg/dL (ref 8.6–10.4)
Chloride: 102 mmol/L (ref 98–110)
GLUCOSE: 73 mg/dL (ref 65–99)
Potassium: 4.7 mmol/L (ref 3.5–5.3)
Sodium: 141 mmol/L (ref 135–146)

## 2016-04-26 NOTE — Progress Notes (Signed)
Cardiology Office Note    Date:  04/26/2016   ID:  Ann Boone, DOB 1941/02/06, MRN [id number]  PCP:  Alain Honey, FNP  Cardiologist: Dr. Tenny Craw  Chief Complaint  Patient presents with  . Follow-up    History of Present Illness:  Ann Boone is a 75 y.o. female first seen by Dr. Tenny Craw 03/27/16 for leg edema. 2Decho 04/10/16 with normal LVEF 60-65% abnormal DD grade indeterminate, mild MR, mod increase filling pressures. Back in ER with worsening edema 04/07/16 ultrasound no DVT. Crt 1.2, BNP 318 , Hgb 10.8,repeat 04/12/16 Crt 1.17 BNP 21. TSH up 5.66, Free T4 and T3 normal.  Patient comes in today and says she feels about the same. Her edema is much better. Her weight is down 13 pounds. She has chronic dyspnea on exertion due to asthma. She does eat out about twice a week usually fast food. She has occasional palpitations when she lays down at night and feels like her heart is racing but when she turns on her side it's normal. This happens once or twice a month. She denies any associated chest pain, shortness of breath, dizziness or presyncope. No exertional symptoms.  Past Medical History:  Diagnosis Date  . Asthma   . CHF (congestive heart failure) (HCC)   . COPD (chronic obstructive pulmonary disease) (HCC) 05/18/2015  . Diabetes mellitus without complication (HCC)   . Hypertension   . Mixed hyperlipidemia     Past Surgical History:  Procedure Laterality Date  . episiotomy repair      Current Medications: Outpatient Medications Prior to Visit  Medication Sig Dispense Refill  . allopurinol (ZYLOPRIM) 300 MG tablet Take 300 mg by mouth daily.    . benazepril (LOTENSIN) 20 MG tablet Take 20 mg by mouth daily.    . carvedilol (COREG) 12.5 MG tablet Take 12.5 mg by mouth 2 (two) times daily with a meal.    . cetirizine (ZYRTEC) 10 MG tablet Take 10 mg by mouth at bedtime.    . colchicine 0.6 MG tablet Take 0.6 mg by mouth 2 (two) times daily as needed. For gout flare-ups     . ferrous sulfate 325 (65 FE) MG tablet Take 325 mg by mouth every evening.     . fluticasone (FLONASE) 50 MCG/ACT nasal spray Place 1-2 sprays into both nostrils daily as needed for rhinitis.    . Fluticasone-Salmeterol (ADVAIR) 250-50 MCG/DOSE AEPB Inhale 1 puff into the lungs 2 (two) times daily.    . furosemide (LASIX) 40 MG tablet Take 1 tablet (40 mg total) by mouth daily. 30 tablet 3  . gabapentin (NEURONTIN) 300 MG capsule Take 300 mg by mouth 3 (three) times daily.    Marland Kitchen glipiZIDE (GLUCOTROL) 5 MG tablet Take 5 mg by mouth 2 (two) times daily.    Marland Kitchen ipratropium (ATROVENT HFA) 17 MCG/ACT inhaler Inhale 2 puffs into the lungs 2 (two) times daily.    Marland Kitchen lovastatin (MEVACOR) 40 MG tablet Take 40 mg by mouth at bedtime.    . Melatonin 3 MG TABS Take 3 mg by mouth at bedtime as needed. For sleep    . montelukast (SINGULAIR) 10 MG tablet Take 10 mg by mouth at bedtime.    Marland Kitchen omeprazole (PRILOSEC) 20 MG capsule Take 20 mg by mouth daily.    . potassium chloride (K-DUR) 10 MEQ tablet Take 1 tablet (10 mEq total) by mouth daily. 30 tablet 3  . traMADol (ULTRAM) 50 MG tablet Take 50 mg by mouth every  6 (six) hours as needed for moderate pain.    . Vitamin D, Ergocalciferol, (DRISDOL) 50000 units CAPS capsule Take 50,000 Units by mouth every 30 (thirty) days.    Marland Kitchen. amoxicillin-clavulanate (AUGMENTIN) 875-125 MG tablet Take 1 tablet by mouth 2 (two) times daily. (Patient not taking: Reported on 04/07/2016) 20 tablet 0   No facility-administered medications prior to visit.      Allergies:   Clindamycin/lincomycin   Social History   Social History  . Marital status: Single    Spouse name: N/A  . Number of children: N/A  . Years of education: N/A   Social History Main Topics  . Smoking status: Former Smoker    Types: Cigarettes  . Smokeless tobacco: Never Used  . Alcohol use No  . Drug use: No  . Sexual activity: Yes    Partners: Male   Other Topics Concern  . None   Social History  Narrative  . None     Family History:  The patient's   family history includes Alcoholism in her brother; Cancer in her sister; Diabetes in her sister; Glaucoma in her brother and father; Heart disease in her father, maternal grandfather, and sister; Hypertension in her brother and mother; Kidney disease in her mother.   ROS:   Please see the history of present illness.    Review of Systems  Constitution: Positive for weakness and malaise/fatigue.  HENT: Negative.   Eyes: Negative.   Cardiovascular: Positive for dyspnea on exertion, leg swelling and palpitations.  Respiratory: Positive for wheezing.   Hematologic/Lymphatic: Negative.   Musculoskeletal: Negative.  Negative for joint pain.  Gastrointestinal: Negative.   Genitourinary: Negative.    All other systems reviewed and are negative.   PHYSICAL EXAM:   VS:  BP (!) 136/54   Pulse (!) 59   Ht 5\' 6"  (1.676 m)   Wt 195 lb (88.5 kg)   SpO2 98%   BMI 31.47 kg/m   Physical Exam  GEN: Obese, in no acute distress  Neck: no JVD, carotid bruits, or masses Cardiac:RRR; no murmurs, rubs, or gallops  Respiratory:  clear to auscultation bilaterally, normal work of breathing GI: soft, nontender, nondistended, + BS Ext: +1 ankle edema bilaterally without cyanosis, clubbing, Good distal pulses bilaterally MS: no deformity or atrophy  Skin: warm and dry, no rash Psych: euthymic mood, full affect  Wt Readings from Last 3 Encounters:  04/26/16 195 lb (88.5 kg)  04/07/16 208 lb (94.3 kg)  03/27/16 208 lb (94.3 kg)      Studies/Labs Reviewed:   EKG:  EKG is not ordered today.    Recent Labs: 03/27/2016: ALT 8; TSH 5.66 04/07/2016: Hemoglobin 10.8; Platelets 155 04/12/2016: Brain Natriuretic Peptide 21.1; BUN 12; Creat 1.17; Potassium 4.2; Sodium 141   Lipid Panel No results found for: CHOL, TRIG, HDL, CHOLHDL, VLDL, LDLCALC, LDLDIRECT  Additional studies/ records that were reviewed today include:  2Decho 04/07/16:  Study  Conclusions   - Left ventricle: The cavity size was normal. Wall thickness was   normal. Systolic function was normal. The estimated ejection   fraction was in the range of 60% to 65%. Diastolic is abnormal,   indeterminate grade. Wall motion was normal; there were no   regional wall motion abnormalities. - Aortic valve: Valve area (VTI): 1.82 cm^2. Valve area (Vmax):   1.82 cm^2. - Mitral valve: There was mild regurgitation. - Left atrium: The atrium was severely dilated. - Pulmonary arteries: Systolic pressure was moderately increased.   PA  peak pressure: 45 mm Hg (S). - Technically adequate study.      ASSESSMENT:    1. Chronic diastolic (congestive) heart failure (HCC)   2. Leg swelling   3. Palpitations      PLAN:  In order of problems listed above:  Chronic diastolic heart failure and leg swelling much better since she's been placed on Lasix. Her weight is down 13 pounds. She still has mild edema. Have instructed her on a 2 g sodium diet. We'll check BMET today for renal function. Follow-up with Dr. Tenny Craw in November.  Palpitations only when she lays down at night and occurs 1-2 times monthly. Could do event recorder if her symptoms increase.  Medication Adjustments/Labs and Tests Ordered: Current medicines are reviewed at length with the patient today.  Concerns regarding medicines are outlined above.  Medication changes, Labs and Tests ordered today are listed in the Patient Instructions below. Patient Instructions  Your physician recommends that you schedule a follow-up appointment in: Dr. Tenny Craw in November.   Your physician recommends that you continue on your current medications as directed. Please refer to the Current Medication list given to you today.  Your physician recommends that you return for lab work in: Today   If you need a refill on your cardiac medications before your next appointment, please call your pharmacy.  Thank you for choosing [facility]!        Elson Clan, PA-C  04/26/2016 12:01 PM    [facility] [address] [place], [place], [place]  [address] Phone: [phone]; Fax: [phone]

## 2016-04-26 NOTE — Patient Instructions (Signed)
Your physician recommends that you schedule a follow-up appointment in: Dr. Tenny Crawoss in November.   Your physician recommends that you continue on your current medications as directed. Please refer to the Current Medication list given to you today.  Your physician recommends that you return for lab work in: Today   If you need a refill on your cardiac medications before your next appointment, please call your pharmacy.  Thank you for choosing [facility]!

## 2016-05-01 ENCOUNTER — Telehealth: Payer: Self-pay | Admitting: *Deleted

## 2016-05-01 MED ORDER — FUROSEMIDE 40 MG PO TABS: 20.0000 mg | ORAL_TABLET | Freq: Every day | ORAL | 3 refills | Status: DC

## 2016-05-01 NOTE — Telephone Encounter (Signed)
-----   Message from Dyann KiefMichele M Lenze, PA-C sent at 04/28/2016  1:54 PM EDT ----- Renal function up a little. Decrease lasix to 40 mg 1/2 tablet daily. Call if you gain 2-3 lbs overnight.

## 2016-06-05 ENCOUNTER — Ambulatory Visit (INDEPENDENT_AMBULATORY_CARE_PROVIDER_SITE_OTHER): Payer: Medicare Other | Admitting: Internal Medicine

## 2016-06-05 ENCOUNTER — Encounter: Payer: Self-pay | Admitting: Internal Medicine

## 2016-06-05 VITALS — BP 120/64 | HR 56 | Ht 64.0 in | Wt 196.0 lb

## 2016-06-05 DIAGNOSIS — I5032 Chronic diastolic (congestive) heart failure: Secondary | ICD-10-CM

## 2016-06-05 LAB — LIPID PANEL
CHOLESTEROL: 119 mg/dL (ref <200)
HDL: 49 mg/dL — AB (ref >50)
LDL Cholesterol: 58 mg/dL
TRIGLYCERIDES: 62 mg/dL (ref <150)
Total CHOL/HDL Ratio: 2.4 Ratio (ref <5.0)
VLDL: 12 mg/dL (ref <30)

## 2016-06-05 LAB — BASIC METABOLIC PANEL
BUN: 23 mg/dL (ref 7–25)
CALCIUM: 9.5 mg/dL (ref 8.6–10.4)
CO2: 30 mmol/L (ref 20–31)
CREATININE: 1.23 mg/dL — AB (ref 0.60–0.93)
Chloride: 101 mmol/L (ref 98–110)
Glucose, Bld: 97 mg/dL (ref 65–99)
Potassium: 4.5 mmol/L (ref 3.5–5.3)
Sodium: 140 mmol/L (ref 135–146)

## 2016-06-05 LAB — AST: AST: 19 U/L (ref 10–35)

## 2016-06-05 NOTE — Patient Instructions (Signed)
Your physician recommends that you schedule a follow-up appointment in: March Dr.Ross 2018   Your physician recommends that you continue on your current medications as directed. Please refer to the Current Medication list given to you today.   Your physician recommends that you return for lab work in: BNP,BMET,Lipids,AST  (  FASTING  )    Thank you for choosing [facility] !

## 2016-06-05 NOTE — Progress Notes (Signed)
Cardiology Office Note   Date:  06/05/2016   ID:  Ann Boone, DOB 07/06/1941, MRN [id number]  PCP:  Alain HoneyPRICE, KRISTEN, FNP  Cardiologist:   Dietrich PatesPaula Danta Baumgardner, MD    F/U of diastolic CHF     History of Present Illness: Ann KannerGracie Belitz is a 75 y.o. female with a history of edema  I saw him in Aug 2017 Echo with LVEF 60 to 65% diastolic dysfunction, mild MR  Moderate increase filling In ER with edema  September  Neg DVT Seen by Sherlynn CarbonM Lnze  Edma improved  Alos history of asthma   SInce seen breathing is OK  No CP  Needs to walk outside      Outpatient Medications Prior to Visit  Medication Sig Dispense Refill  . allopurinol (ZYLOPRIM) 300 MG tablet Take 300 mg by mouth daily.    . benazepril (LOTENSIN) 20 MG tablet Take 20 mg by mouth daily.    . carvedilol (COREG) 12.5 MG tablet Take 12.5 mg by mouth 2 (two) times daily with a meal.    . cetirizine (ZYRTEC) 10 MG tablet Take 10 mg by mouth at bedtime.    . colchicine 0.6 MG tablet Take 0.6 mg by mouth 2 (two) times daily as needed. For gout flare-ups    . ferrous sulfate 325 (65 FE) MG tablet Take 325 mg by mouth every evening.     . fluticasone (FLONASE) 50 MCG/ACT nasal spray Place 1-2 sprays into both nostrils daily as needed for rhinitis.    . Fluticasone-Salmeterol (ADVAIR) 250-50 MCG/DOSE AEPB Inhale 1 puff into the lungs 2 (two) times daily.    . furosemide (LASIX) 40 MG tablet Take 0.5 tablets (20 mg total) by mouth daily. 30 tablet 3  . gabapentin (NEURONTIN) 300 MG capsule Take 300 mg by mouth 3 (three) times daily.    Marland Kitchen. glipiZIDE (GLUCOTROL) 5 MG tablet Take 5 mg by mouth 2 (two) times daily.    Marland Kitchen. ipratropium (ATROVENT HFA) 17 MCG/ACT inhaler Inhale 2 puffs into the lungs 2 (two) times daily.    Marland Kitchen. lovastatin (MEVACOR) 40 MG tablet Take 40 mg by mouth at bedtime.    . Melatonin 3 MG TABS Take 3 mg by mouth at bedtime as needed. For sleep    . montelukast (SINGULAIR) 10 MG tablet Take 10 mg by mouth at bedtime.    Marland Kitchen. omeprazole  (PRILOSEC) 20 MG capsule Take 20 mg by mouth daily.    . potassium chloride (K-DUR) 10 MEQ tablet Take 1 tablet (10 mEq total) by mouth daily. 30 tablet 3  . traMADol (ULTRAM) 50 MG tablet Take 50 mg by mouth every 6 (six) hours as needed for moderate pain.    . Vitamin D, Ergocalciferol, (DRISDOL) 50000 units CAPS capsule Take 50,000 Units by mouth every 30 (thirty) days.     No facility-administered medications prior to visit.      Allergies:   Clindamycin/lincomycin   Past Medical History:  Diagnosis Date  . Asthma   . CHF (congestive heart failure) (HCC)   . COPD (chronic obstructive pulmonary disease) (HCC) 05/18/2015  . Diabetes mellitus without complication (HCC)   . Hypertension   . Mixed hyperlipidemia     Past Surgical History:  Procedure Laterality Date  . episiotomy repair       Social History:  The patient  reports that she has quit smoking. Her smoking use included Cigarettes. She has never used smokeless tobacco. She reports that she does not drink alcohol or  use drugs.   Family History:  The patient's family history includes Alcoholism in her brother; Cancer in her sister; Diabetes in her sister; Glaucoma in her brother and father; Heart disease in her father, maternal grandfather, and sister; Hypertension in her brother and mother; Kidney disease in her mother.    ROS:  Please see the history of present illness. All other systems are reviewed and  Negative to the above problem except as noted.    PHYSICAL EXAM: VS:  BP 120/64   Pulse (!) 56   Ht 5\' 4"  (1.626 m)   Wt 196 lb (88.9 kg)   SpO2 96%   BMI 33.64 kg/m   GEN: Well nourished, well developed, in no acute distress  HEENT: normal  Neck: no JVD, carotid bruits, or masses Cardiac: RRR; no murmurs, rubs, or gallops,Tr edema  Respiratory:  clear to auscultation bilaterally, normal work of breathing GI: soft, nontender, nondistended, + BS  No hepatomegaly  MS: no deformity Moving all extremities     Skin: warm and dry, no rash Neuro:  Strength and sensation are intact Psych: euthymic mood, full affect   EKG:  EKG is not  ordered today.   Lipid Panel No results found for: CHOL, TRIG, HDL, CHOLHDL, VLDL, LDLCALC, LDLDIRECT    Wt Readings from Last 3 Encounters:  06/05/16 196 lb (88.9 kg)  04/26/16 195 lb (88.5 kg)  04/07/16 208 lb (94.3 kg)      ASSESSMENT AND PLAN:  1  Chronic diastolic CHF  Volume status is pretty good.  I would keep on same meds for now Check BMET and BNP  F/U in March  2  HL  Check lipids   Encouraged her to walk  F/U next spring     Current medicines are reviewed at length with the patient today.  The patient does not have concerns regarding medicines.  Signed, Dietrich PatesPaula Lashonta Pilling, MD  06/05/2016 8:32 AM    [facility] [address], [place], [place]  [address] Phone: [phone]; Fax: [phone]

## 2016-06-06 LAB — BRAIN NATRIURETIC PEPTIDE: BRAIN NATRIURETIC PEPTIDE: 52.6 pg/mL (ref <100)

## 2016-08-03 ENCOUNTER — Other Ambulatory Visit: Payer: Self-pay | Admitting: Internal Medicine

## 2016-10-03 ENCOUNTER — Other Ambulatory Visit: Payer: Self-pay | Admitting: Internal Medicine

## 2016-10-03 ENCOUNTER — Other Ambulatory Visit: Payer: Self-pay

## 2016-10-03 MED ORDER — FUROSEMIDE 40 MG PO TABS: 20.0000 mg | ORAL_TABLET | Freq: Every day | ORAL | 3 refills | Status: DC

## 2016-10-03 NOTE — Telephone Encounter (Signed)
[facility]

## 2016-10-13 ENCOUNTER — Ambulatory Visit: Payer: Medicare Other | Admitting: Adult Health

## 2016-10-13 ENCOUNTER — Ambulatory Visit (INDEPENDENT_AMBULATORY_CARE_PROVIDER_SITE_OTHER): Payer: Medicare Other | Admitting: Physician Assistant

## 2016-10-13 ENCOUNTER — Encounter: Payer: Self-pay | Admitting: Physician Assistant

## 2016-10-13 ENCOUNTER — Other Ambulatory Visit: Payer: Self-pay | Admitting: Physician Assistant

## 2016-10-13 VITALS — BP 142/64 | HR 65 | Ht 64.0 in | Wt 203.0 lb

## 2016-10-13 DIAGNOSIS — E669 Obesity, unspecified: Secondary | ICD-10-CM | POA: Diagnosis not present

## 2016-10-13 DIAGNOSIS — N183 Chronic kidney disease, stage 3 unspecified: Secondary | ICD-10-CM | POA: Insufficient documentation

## 2016-10-13 DIAGNOSIS — I5032 Chronic diastolic (congestive) heart failure: Secondary | ICD-10-CM

## 2016-10-13 LAB — BASIC METABOLIC PANEL
BUN: 19 mg/dL (ref 7–25)
CHLORIDE: 106 mmol/L (ref 98–110)
CO2: 28 mmol/L (ref 20–31)
Calcium: 9.3 mg/dL (ref 8.6–10.4)
Creat: 1.11 mg/dL — ABNORMAL HIGH (ref 0.60–0.93)
GLUCOSE: 75 mg/dL (ref 65–99)
POTASSIUM: 4.6 mmol/L (ref 3.5–5.3)
SODIUM: 140 mmol/L (ref 135–146)

## 2016-10-13 NOTE — Progress Notes (Signed)
Cardiology Office Note    Date:  10/13/2016   ID:  Ann Boone, DOB 1941-04-11, MRN [id number]  PCP:  Alain Honey, FNP  Cardiologist: Dr. Tenny Craw  Chief Complaint  Patient presents with  . Follow-up    History of Present Illness:  Ann Boone is a 76 y.o. female first seen by Dr. Tenny Craw 03/27/16 for leg edema. 2Decho 04/10/16 with normal LVEF 60-65% abnormal DD grade indeterminate, mild MR, mod increase filling pressures. Back in ER with worsening edema 04/07/16 ultrasound no DVT. Crt 1.2, BNP 318 , Hgb 10.8,repeat 04/12/16 Crt 1.17 BNP 21. TSH up 5.66, Free T4 and T3 normal. I saw the patient 04/26/16 and edema was much better with 13 pound weight loss. She has chronic dyspnea on exertion due to asthma. Was still eating fast food. She saw Dr. Tenny Craw 05/2016 and was doing well. No changes were made. Creatinine was 1.23, BNP was 52 and lipid status were stable.  Patient comes in today feeling well. Her weight is up 7 pounds but she says it's from excessive calories and not fluid. Her fluid has been stable. She is not exercising as much as she'd like. She says she is going to start walking. She has chronic dyspnea on exertion secondary to her asthma. She is watching her salt closely. She only goes out to eat twice a month now.    Past Medical History:  Diagnosis Date  . Asthma   . CHF (congestive heart failure) (HCC)   . COPD (chronic obstructive pulmonary disease) (HCC) 05/18/2015  . Diabetes mellitus without complication (HCC)   . Hypertension   . Mixed hyperlipidemia     Past Surgical History:  Procedure Laterality Date  . episiotomy repair      Current Medications: Outpatient Medications Prior to Visit  Medication Sig Dispense Refill  . allopurinol (ZYLOPRIM) 300 MG tablet Take 300 mg by mouth daily.    . benazepril (LOTENSIN) 20 MG tablet Take 20 mg by mouth daily.    . carvedilol (COREG) 12.5 MG tablet Take 12.5 mg by mouth 2 (two) times daily with a meal.    .  cetirizine (ZYRTEC) 10 MG tablet Take 10 mg by mouth at bedtime.    . colchicine 0.6 MG tablet Take 0.6 mg by mouth 2 (two) times daily as needed. For gout flare-ups    . ferrous sulfate 325 (65 FE) MG tablet Take 325 mg by mouth every evening.     . fluticasone (FLONASE) 50 MCG/ACT nasal spray Place 1-2 sprays into both nostrils daily as needed for rhinitis.    . Fluticasone-Salmeterol (ADVAIR) 250-50 MCG/DOSE AEPB Inhale 1 puff into the lungs 2 (two) times daily.    . furosemide (LASIX) 40 MG tablet Take 0.5 tablets (20 mg total) by mouth daily. 30 tablet 3  . gabapentin (NEURONTIN) 300 MG capsule Take 300 mg by mouth 3 (three) times daily.    Marland Kitchen glipiZIDE (GLUCOTROL) 5 MG tablet Take 5 mg by mouth 2 (two) times daily.    Marland Kitchen ipratropium (ATROVENT HFA) 17 MCG/ACT inhaler Inhale 2 puffs into the lungs 2 (two) times daily.    Marland Kitchen lovastatin (MEVACOR) 40 MG tablet Take 40 mg by mouth at bedtime.    . Melatonin 3 MG TABS Take 3 mg by mouth at bedtime as needed. For sleep    . montelukast (SINGULAIR) 10 MG tablet Take 10 mg by mouth at bedtime.    Marland Kitchen omeprazole (PRILOSEC) 20 MG capsule Take 20 mg by mouth daily.    Marland Kitchen  potassium chloride (K-DUR) 10 MEQ tablet Take 1 tablet (10 mEq total) by mouth daily. 30 tablet 3  . potassium chloride (K-DUR,KLOR-CON) 10 MEQ tablet Take 1 tablet (10 mEq total) by mouth daily. 30 tablet 10  . traMADol (ULTRAM) 50 MG tablet Take 50 mg by mouth every 6 (six) hours as needed for moderate pain.    . Vitamin D, Ergocalciferol, (DRISDOL) 50000 units CAPS capsule Take 50,000 Units by mouth every 30 (thirty) days.     No facility-administered medications prior to visit.      Allergies:   Clindamycin/lincomycin   Social History   Social History  . Marital status: Single    Spouse name: N/A  . Number of children: N/A  . Years of education: N/A   Social History Main Topics  . Smoking status: Former Smoker    Types: Cigarettes  . Smokeless tobacco: Never Used  . Alcohol  use No  . Drug use: No  . Sexual activity: Yes    Partners: Male   Other Topics Concern  . None   Social History Narrative  . None     Family History:  The patient's   family history includes Alcoholism in her brother; Cancer in her sister; Diabetes in her sister; Glaucoma in her brother and father; Heart disease in her father, maternal grandfather, and sister; Hypertension in her brother and mother; Kidney disease in her mother.   ROS:   Please see the history of present illness.    Review of Systems  Constitution: Negative.  HENT: Negative.   Eyes: Negative.   Cardiovascular: Positive for dyspnea on exertion and leg swelling.  Respiratory: Negative.   Hematologic/Lymphatic: Negative.   Musculoskeletal: Positive for muscle weakness. Negative for joint pain.  Gastrointestinal: Negative.   Genitourinary: Negative.   Neurological: Negative.    All other systems reviewed and are negative.   PHYSICAL EXAM:   VS:  BP (!) 142/64   Pulse 65   Ht 5\' 4"  (1.626 m)   Wt 203 lb (92.1 kg)   SpO2 99%   BMI 34.84 kg/m   Physical Exam  GEN: Obese, in no acute distress  Neck: no JVD, carotid bruits, or masses Cardiac:RRR; positive S4, distant heart sounds no murmurs, rubs, or gallops  Respiratory:  clear to auscultation bilaterally, normal work of breathing GI: soft, nontender, nondistended, + BS Ext: Trace of ankle edema otherwise lower extremities without cyanosis, clubbing,  Good distal pulses bilaterally Psych: euthymic mood, full affect  Wt Readings from Last 3 Encounters:  10/13/16 203 lb (92.1 kg)  06/05/16 196 lb (88.9 kg)  04/26/16 195 lb (88.5 kg)      Studies/Labs Reviewed:   EKG:  EKG is not ordered today.   Recent Labs: 03/27/2016: ALT 8; TSH 5.66 04/07/2016: Hemoglobin 10.8; Platelets 155 06/05/2016: Brain Natriuretic Peptide 52.6; BUN 23; Creat 1.23; Potassium 4.5; Sodium 140   Lipid Panel    Component Value Date/Time   CHOL 119 06/05/2016 0904   TRIG 62  06/05/2016 0904   HDL 49 (L) 06/05/2016 0904   CHOLHDL 2.4 06/05/2016 0904   VLDL 12 06/05/2016 0904   LDLCALC 58 06/05/2016 0904    Additional studies/ records that were reviewed today include:    2Decho 04/07/16:   Study Conclusions   - Left ventricle: The cavity size was normal. Wall thickness was   normal. Systolic function was normal. The estimated ejection   fraction was in the range of 60% to 65%. Diastolic is abnormal,  indeterminate grade. Wall motion was normal; there were no   regional wall motion abnormalities. - Aortic valve: Valve area (VTI): 1.82 cm^2. Valve area (Vmax):   1.82 cm^2. - Mitral valve: There was mild regurgitation. - Left atrium: The atrium was severely dilated. - Pulmonary arteries: Systolic pressure was moderately increased.   PA peak pressure: 45 mm Hg (S). - Technically adequate study.         ASSESSMENT:    1. Chronic diastolic heart failure (HCC)   2. CKD (chronic kidney disease) stage 3, GFR 30-59 ml/min   3. Obesity (BMI 30.0-34.9)      PLAN:  In order of problems listed above:  Chronic diastolic CHF well compensated. Weight is up 7 pounds today but she has no edema. Suspect it's due to excessive calories. No changes in medications today. Follow-up with Dr. Tenny Craw in 4-5 months.  CK D we'll check kidney function today  Obesity weight loss and exercise program recommended.    Medication Adjustments/Labs and Tests Ordered: Current medicines are reviewed at length with the patient today.  Concerns regarding medicines are outlined above.  Medication changes, Labs and Tests ordered today are listed in the Patient Instructions below. There are no Patient Instructions on file for this visit.   Elson Clan, PA-C  10/13/2016 11:39 AM    [facility] [address] [place], [place], [place]  [address] Phone: [phone]; Fax: [phone]

## 2016-10-13 NOTE — Patient Instructions (Signed)
Your physician recommends that you schedule a follow-up appointment in:  4-5 months with Dr Tenny Crawoss    Get lab work today   BMET     Your physician recommends that you continue on your current medications as directed. Please refer to the Current Medication list given to you today.      Thank you for choosing [facility] !

## 2017-02-11 NOTE — Progress Notes (Signed)
Cardiology Office Note   Date:  02/12/2017   ID:  Ann KannerGracie Batra, DOB 02/07/1941, MRN [id number]  PCP:  Alain HoneyPrice, Kristen, FNP  Cardiologist:   Dietrich PatesPaula Ross, MD   Pt presents for f/u of diastolic CHF      History of Present Illness: Ann Boone is a 76 y.o. female with a history of edema, diastoolic CHF.  I saw her last fall  She was seen by Leda GauzeM Lenze in march   Since seen she says her wt is up some  She thinks some fluid som fat. Breathing is OK No CP        Current Meds  Medication Sig  . allopurinol (ZYLOPRIM) 300 MG tablet Take 300 mg by mouth daily.  . benazepril (LOTENSIN) 20 MG tablet Take 20 mg by mouth daily.  . carvedilol (COREG) 12.5 MG tablet Take 12.5 mg by mouth 2 (two) times daily with a meal.  . cetirizine (ZYRTEC) 10 MG tablet Take 10 mg by mouth at bedtime.  . colchicine 0.6 MG tablet Take 0.6 mg by mouth 2 (two) times daily as needed. For gout flare-ups  . ferrous sulfate 325 (65 FE) MG tablet Take 325 mg by mouth every evening.   . fluticasone (FLONASE) 50 MCG/ACT nasal spray Place 1-2 sprays into both nostrils daily as needed for rhinitis.  . Fluticasone-Salmeterol (ADVAIR) 250-50 MCG/DOSE AEPB Inhale 1 puff into the lungs 2 (two) times daily.  Marland Kitchen. gabapentin (NEURONTIN) 300 MG capsule Take 300 mg by mouth 3 (three) times daily.  Marland Kitchen. glipiZIDE (GLUCOTROL) 5 MG tablet Take 5 mg by mouth 2 (two) times daily.  Marland Kitchen. ipratropium (ATROVENT HFA) 17 MCG/ACT inhaler Inhale 2 puffs into the lungs 2 (two) times daily.  Marland Kitchen. lovastatin (MEVACOR) 40 MG tablet Take 40 mg by mouth at bedtime.  . Melatonin 3 MG TABS Take 3 mg by mouth at bedtime as needed. For sleep  . montelukast (SINGULAIR) 10 MG tablet Take 10 mg by mouth at bedtime.  Marland Kitchen. omeprazole (PRILOSEC) 20 MG capsule Take 20 mg by mouth daily.  . traMADol (ULTRAM) 50 MG tablet Take 50 mg by mouth every 6 (six) hours as needed for moderate pain.  . Vitamin D, Ergocalciferol, (DRISDOL) 50000 units CAPS capsule Take 50,000  Units by mouth every 30 (thirty) days.  . [DISCONTINUED] furosemide (LASIX) 40 MG tablet Take 0.5 tablets (20 mg total) by mouth daily.  . [DISCONTINUED] potassium chloride (K-DUR) 10 MEQ tablet Take 1 tablet (10 mEq total) by mouth daily.  . [DISCONTINUED] potassium chloride (K-DUR,KLOR-CON) 10 MEQ tablet Take 1 tablet (10 mEq total) by mouth daily.     Allergies:   Clindamycin/lincomycin   Past Medical History:  Diagnosis Date  . Asthma   . CHF (congestive heart failure) (HCC)   . COPD (chronic obstructive pulmonary disease) (HCC) 05/18/2015  . Diabetes mellitus without complication (HCC)   . Hypertension   . Mixed hyperlipidemia     Past Surgical History:  Procedure Laterality Date  . episiotomy repair       Social History:  The patient  reports that she has quit smoking. Her smoking use included Cigarettes. She has never used smokeless tobacco. She reports that she does not drink alcohol or use drugs.   Family History:  The patient's family history includes Alcoholism in her brother; Cancer in her sister; Diabetes in her sister; Glaucoma in her brother and father; Heart disease in her father, maternal grandfather, and sister; Hypertension in her brother and mother; Kidney  disease in her mother.    ROS:  Please see the history of present illness. All other systems are reviewed and  Negative to the above problem except as noted.    PHYSICAL EXAM: VS:  BP 138/76   Pulse (!) 52   Ht 5\' 6"  (1.676 m)   Wt 215 lb (97.5 kg)   SpO2 97%   BMI 34.70 kg/m   GEN: Well nourished, well developed, in no acute distress  HEENT: normal  Neck: JVP is increased  , carotid bruits, or masses Cardiac: RRR; no murmurs, rubs, or gallops,Tr edema  Respiratory:  clear to auscultation bilaterally, normal work of breathing GI: soft, nontender, nondistended, + BS  No hepatomegaly  MS: no deformity Moving all extremities   Skin: warm and dry, no rash Neuro:  Strength and sensation are  intact Psych: euthymic mood, full affect   EKG:  EKG is not ordered today.   Lipid Panel    Component Value Date/Time   CHOL 119 06/05/2016 0904   TRIG 62 06/05/2016 0904   HDL 49 (L) 06/05/2016 0904   CHOLHDL 2.4 06/05/2016 0904   VLDL 12 06/05/2016 0904   LDLCALC 58 06/05/2016 0904      Wt Readings from Last 3 Encounters:  02/12/17 215 lb (97.5 kg)  10/13/16 203 lb (92.1 kg)  06/05/16 196 lb (88.9 kg)      ASSESSMENT AND PLAN:  1  Acute on chronic diastlic CHF  Volume is up a little on exam She says she is watching salt I would recomm increasing lasix to 40 mg 2x per wk with 20 KCL on those days  Continue 20 Lasix and 10 K on other days  BMET today and then in 3 wks   F/U in 3 months in clinic      Current medicines are reviewed at length with the patient today.  The patient does not have concerns regarding medicines.  Signed, Dietrich Pates, MD  02/12/2017 10:35 PM    [facility] [address] [place], [place], [place]  [address] Phone: [phone]; Fax: [phone]

## 2017-02-12 ENCOUNTER — Other Ambulatory Visit (HOSPITAL_COMMUNITY)
Admission: RE | Admit: 2017-02-12 | Discharge: 2017-02-12 | Disposition: A | Discharge status: Home / Self Care | Payer: Medicare Other | Source: Ambulatory Visit | Attending: Internal Medicine | Admitting: Internal Medicine

## 2017-02-12 ENCOUNTER — Ambulatory Visit (INDEPENDENT_AMBULATORY_CARE_PROVIDER_SITE_OTHER): Payer: Medicare Other | Admitting: Internal Medicine

## 2017-02-12 ENCOUNTER — Encounter: Payer: Self-pay | Admitting: Internal Medicine

## 2017-02-12 VITALS — BP 138/76 | HR 52 | Ht 66.0 in | Wt 215.0 lb

## 2017-02-12 DIAGNOSIS — I5033 Acute on chronic diastolic (congestive) heart failure: Secondary | ICD-10-CM

## 2017-02-12 DIAGNOSIS — R600 Localized edema: Secondary | ICD-10-CM | POA: Diagnosis not present

## 2017-02-12 LAB — BASIC METABOLIC PANEL
ANION GAP: 8 (ref 5–15)
BUN: 21 mg/dL — ABNORMAL HIGH (ref 6–20)
CALCIUM: 9.7 mg/dL (ref 8.9–10.3)
CO2: 28 mmol/L (ref 22–32)
CREATININE: 1.18 mg/dL — AB (ref 0.44–1.00)
Chloride: 105 mmol/L (ref 101–111)
GFR, EST AFRICAN AMERICAN: 51 mL/min — AB (ref >60)
GFR, EST NON AFRICAN AMERICAN: 44 mL/min — AB (ref >60)
Glucose, Bld: 87 mg/dL (ref 65–99)
Potassium: 4.8 mmol/L (ref 3.5–5.1)
SODIUM: 141 mmol/L (ref 135–145)

## 2017-02-12 LAB — BRAIN NATRIURETIC PEPTIDE: B NATRIURETIC PEPTIDE 5: 22 pg/mL (ref 0.0–100.0)

## 2017-02-12 MED ORDER — FUROSEMIDE 40 MG PO TABS: ORAL_TABLET | ORAL | 3 refills | Status: DC

## 2017-02-12 MED ORDER — POTASSIUM CHLORIDE ER 10 MEQ PO TBCR: EXTENDED_RELEASE_TABLET | ORAL | 3 refills | Status: DC

## 2017-02-12 NOTE — Patient Instructions (Addendum)
Your physician recommends that you schedule a follow-up appointment in: 3 months with Dr Tenny Crawoss    Get blood work TODAY and again in 3 weeks (03/05/17)      Take Lasix 20 mg (1/2 pill) every day  EXCEPT Monday and Thursday take 40 mg (1 whole pill)   Take 2 potassium pills (20 meq) on Monday and Thursdays also         Thank you for choosing [facility] !

## 2017-03-05 ENCOUNTER — Other Ambulatory Visit (HOSPITAL_COMMUNITY)
Admission: RE | Admit: 2017-03-05 | Discharge: 2017-03-05 | Disposition: A | Discharge status: Home / Self Care | Payer: Medicare Other | Source: Ambulatory Visit | Attending: Internal Medicine | Admitting: Internal Medicine

## 2017-03-05 DIAGNOSIS — R600 Localized edema: Secondary | ICD-10-CM | POA: Diagnosis present

## 2017-03-05 LAB — BASIC METABOLIC PANEL
ANION GAP: 7 (ref 5–15)
BUN: 36 mg/dL — ABNORMAL HIGH (ref 6–20)
CALCIUM: 9.4 mg/dL (ref 8.9–10.3)
CO2: 29 mmol/L (ref 22–32)
CREATININE: 1.61 mg/dL — AB (ref 0.44–1.00)
Chloride: 105 mmol/L (ref 101–111)
GFR calc non Af Amer: 30 mL/min — ABNORMAL LOW (ref >60)
GFR, EST AFRICAN AMERICAN: 35 mL/min — AB (ref >60)
Glucose, Bld: 137 mg/dL — ABNORMAL HIGH (ref 65–99)
Potassium: 5 mmol/L (ref 3.5–5.1)
SODIUM: 141 mmol/L (ref 135–145)

## 2017-03-07 ENCOUNTER — Telehealth: Payer: Self-pay

## 2017-03-07 DIAGNOSIS — Z79899 Other long term (current) drug therapy: Secondary | ICD-10-CM

## 2017-03-07 MED ORDER — FUROSEMIDE 40 MG PO TABS: ORAL_TABLET | ORAL | 3 refills | Status: DC

## 2017-03-07 NOTE — Telephone Encounter (Signed)
-----   Message from Eustace MooreLydia M Anderson, LPN sent at 9/1/47828/02/2017  9:36 AM EDT ----- Regarding: [place] patient   ----- Message ----- From: Pricilla Riffleoss, Paula V, MD Sent: 03/07/2017   1:52 AM To: [facility] Triage  Pt appears to be a little dry I would take lasix at most 1x per week if feels like extra fluid   I would stop KCL supplement  Watch salt intake Repeat BMET in 6 wks

## 2017-03-07 NOTE — Telephone Encounter (Signed)
Spoke with pt. Explained changes to her, she voiced understanding.

## 2017-04-17 ENCOUNTER — Other Ambulatory Visit (HOSPITAL_COMMUNITY)
Admission: RE | Admit: 2017-04-17 | Discharge: 2017-04-17 | Disposition: A | Discharge status: Home / Self Care | Payer: Medicare Other | Source: Ambulatory Visit | Attending: Internal Medicine | Admitting: Internal Medicine

## 2017-04-17 DIAGNOSIS — Z79899 Other long term (current) drug therapy: Secondary | ICD-10-CM | POA: Insufficient documentation

## 2017-04-17 LAB — BASIC METABOLIC PANEL
Anion gap: 8 (ref 5–15)
BUN: 19 mg/dL (ref 6–20)
CHLORIDE: 106 mmol/L (ref 101–111)
CO2: 28 mmol/L (ref 22–32)
CREATININE: 1.17 mg/dL — AB (ref 0.44–1.00)
Calcium: 9.3 mg/dL (ref 8.9–10.3)
GFR, EST AFRICAN AMERICAN: 51 mL/min — AB (ref >60)
GFR, EST NON AFRICAN AMERICAN: 44 mL/min — AB (ref >60)
Glucose, Bld: 153 mg/dL — ABNORMAL HIGH (ref 65–99)
Potassium: 3.9 mmol/L (ref 3.5–5.1)
SODIUM: 142 mmol/L (ref 135–145)

## 2017-05-28 ENCOUNTER — Ambulatory Visit (INDEPENDENT_AMBULATORY_CARE_PROVIDER_SITE_OTHER): Payer: Medicare Other | Admitting: Internal Medicine

## 2017-05-28 ENCOUNTER — Encounter: Payer: Self-pay | Admitting: Internal Medicine

## 2017-05-28 VITALS — BP 150/70 | HR 58 | Ht 66.0 in | Wt 219.0 lb

## 2017-05-28 DIAGNOSIS — E782 Mixed hyperlipidemia: Secondary | ICD-10-CM

## 2017-05-28 DIAGNOSIS — R002 Palpitations: Secondary | ICD-10-CM

## 2017-05-28 DIAGNOSIS — I10 Essential (primary) hypertension: Secondary | ICD-10-CM | POA: Diagnosis not present

## 2017-05-28 DIAGNOSIS — I519 Heart disease, unspecified: Secondary | ICD-10-CM | POA: Diagnosis not present

## 2017-05-28 DIAGNOSIS — I5189 Other ill-defined heart diseases: Secondary | ICD-10-CM

## 2017-05-28 NOTE — Patient Instructions (Signed)
Your physician wants you to follow-up in: April 2019 with Dr.Ross You will receive a reminder letter in the mail two months in advance. If you don't receive a letter, please call our office to schedule the follow-up appointment.   Your physician recommends that you continue on your current medications as directed. Please refer to the Current Medication list given to you today.    If you need a refill on your cardiac medications before your next appointment, please call your pharmacy.     No lab work or tests ordered today.      Thank you for choosing [facility] !

## 2017-05-28 NOTE — Progress Notes (Signed)
Cardiology Office Note   Date:  05/28/2017   ID:  Ann Boone, DOB 07/26/1941, MRN [id number]  PCP:  Alain HoneyPrice, Kristen, FNP  Cardiologist:   Dietrich PatesPaula Collen Hostler, MD   Pt presents for f/u of diastolic CHF      History of Present Illness: Ann Boone is a 76 y.o. female with a history of edema, diastoolic CHF.  I saw her in July  SInce seen her breathing is OK  Denies CP  Does say she has to get walking more        Current Meds  Medication Sig  . allopurinol (ZYLOPRIM) 300 MG tablet Take 300 mg by mouth daily.  . benazepril (LOTENSIN) 20 MG tablet Take 20 mg by mouth daily.  . carvedilol (COREG) 12.5 MG tablet Take 12.5 mg by mouth 2 (two) times daily with a meal.  . cetirizine (ZYRTEC) 10 MG tablet Take 10 mg by mouth at bedtime.  . colchicine 0.6 MG tablet Take 0.6 mg by mouth 2 (two) times daily as needed. For gout flare-ups  . ferrous sulfate 325 (65 FE) MG tablet Take 325 mg by mouth every evening.   . fluticasone (FLONASE) 50 MCG/ACT nasal spray Place 1-2 sprays into both nostrils daily as needed for rhinitis.  . Fluticasone-Salmeterol (ADVAIR) 250-50 MCG/DOSE AEPB Inhale 1 puff into the lungs 2 (two) times daily.  . furosemide (LASIX) 40 MG tablet Take 1 tablet once weekly for weight gain or extra fluid retention.  . gabapentin (NEURONTIN) 300 MG capsule Take 300 mg by mouth 3 (three) times daily.  Marland Kitchen. glipiZIDE (GLUCOTROL) 5 MG tablet Take 5 mg by mouth 2 (two) times daily.  Marland Kitchen. ipratropium (ATROVENT HFA) 17 MCG/ACT inhaler Inhale 2 puffs into the lungs 2 (two) times daily.  Marland Kitchen. lovastatin (MEVACOR) 40 MG tablet Take 40 mg by mouth at bedtime.  . Melatonin 3 MG TABS Take 3 mg by mouth at bedtime as needed. For sleep  . montelukast (SINGULAIR) 10 MG tablet Take 10 mg by mouth at bedtime.  Marland Kitchen. omeprazole (PRILOSEC) 20 MG capsule Take 20 mg by mouth daily.  . traMADol (ULTRAM) 50 MG tablet Take 50 mg by mouth every 6 (six) hours as needed for moderate pain.  . Vitamin D,  Ergocalciferol, (DRISDOL) 50000 units CAPS capsule Take 50,000 Units by mouth every 30 (thirty) days.     Allergies:   Clindamycin/lincomycin   Past Medical History:  Diagnosis Date  . Asthma   . CHF (congestive heart failure) (HCC)   . COPD (chronic obstructive pulmonary disease) (HCC) 05/18/2015  . Diabetes mellitus without complication (HCC)   . Hypertension   . Mixed hyperlipidemia     Past Surgical History:  Procedure Laterality Date  . CATARACT EXTRACTION    . episiotomy repair       Social History:  The patient  reports that she quit smoking about 33 years ago. Her smoking use included Cigarettes. She has never used smokeless tobacco. She reports that she does not drink alcohol or use drugs.   Family History:  The patient's family history includes Alcoholism in her brother; Cancer in her sister; Diabetes in her sister; Glaucoma in her brother and father; Heart disease in her father, maternal grandfather, and sister; Hypertension in her brother and mother; Kidney disease in her mother.    ROS:  Please see the history of present illness. All other systems are reviewed and  Negative to the above problem except as noted.    PHYSICAL EXAM: VS:  BP (!) 150/70 (BP Location: Right Arm)   Pulse (!) 58   Ht 5\' 6"  (1.676 m)   Wt 219 lb (99.3 kg)   SpO2 97%   BMI 35.35 kg/m   GEN: Well nourished, well developed, in no acute distress  HEENT: normal  Neck: JVP is normal  No , carotid bruits, or masses Cardiac: RRR; no murmurs, rubs, or gallops,No   edema  Respiratory:  clear to auscultation bilaterally, normal work of breathing GI: soft, nontender, nondistended, + BS  No hepatomegaly  MS: no deformity Moving all extremities   Skin: warm and dry, no rash Neuro:  Strength and sensation are intact Psych: euthymic mood, full affect   EKG:  EKG shows SB 50     Lipid Panel    Component Value Date/Time   CHOL 119 06/05/2016 0904   TRIG 62 06/05/2016 0904   HDL 49 (L)  06/05/2016 0904   CHOLHDL 2.4 06/05/2016 0904   VLDL 12 06/05/2016 0904   LDLCALC 58 06/05/2016 0904      Wt Readings from Last 3 Encounters:  05/28/17 219 lb (99.3 kg)  02/12/17 215 lb (97.5 kg)  10/13/16 203 lb (92.1 kg)      ASSESSMENT AND PLAN:  1  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  The patient does not have concerns regarding medicines.  Signed, Dietrich Pates, MD  05/28/2017 8:33 AM    [facility] [address] [place], [place], [place]  [address] Phone: [phone]; Fax: [phone]

## 2017-08-27 IMAGING — CT CT ABD-PELV W/ CM
2 of 5 series · 17 of 46 positions shown, 19 images · IV contrast (iopamidol)
Comparison: None.

CLINICAL DATA: Inguinal adenopathy seen on ultrasound.

EXAM:
CT ABDOMEN AND PELVIS WITH CONTRAST
TECHNIQUE: Multidetector CT imaging of the abdomen and pelvis was performed
using the standard protocol following bolus administration of
intravenous contrast.
CONTRAST:  100mL MSHN2R-6EE IOPAMIDOL ([id number]) INJECTION 61%

[Series 2: routine abd pel with · axial · 0.74mm/px · z∈[-390,-34]mm · 14 of 81 slices shown, 16 images]
[im 5/81  soft-tissue]
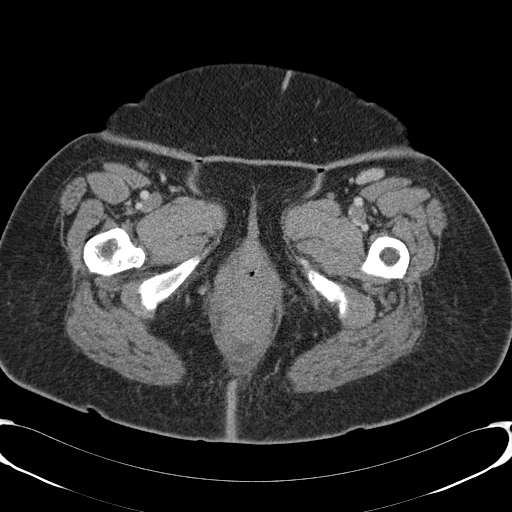
[im 5/81  bone]
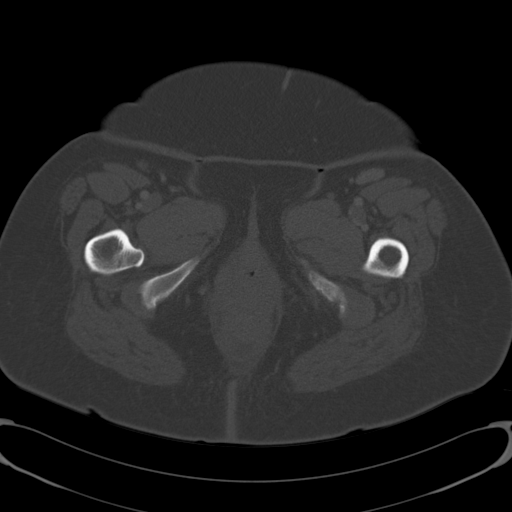
[im 9/81  soft-tissue]
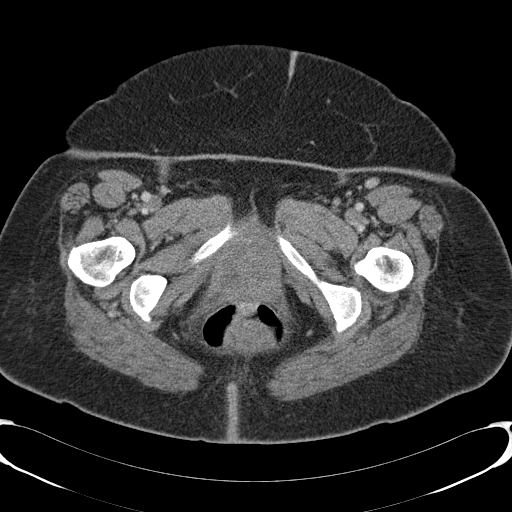
[im 18/81  soft-tissue]
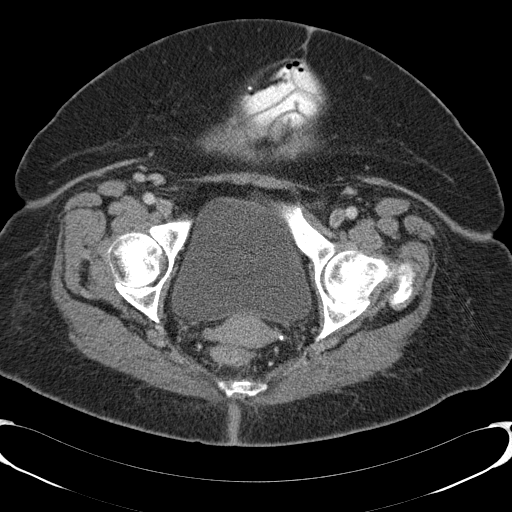
[im 23/81  soft-tissue]
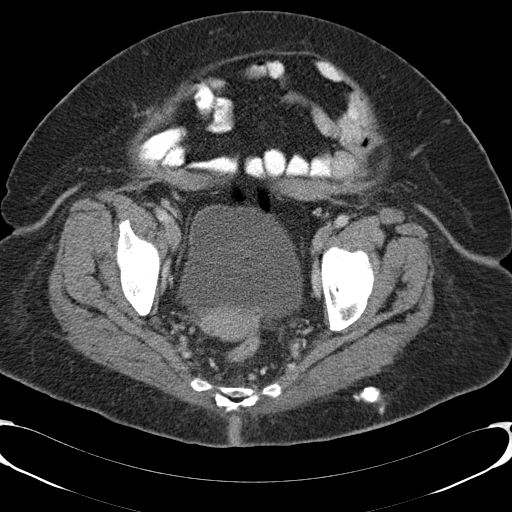
[im 27/81  soft-tissue]
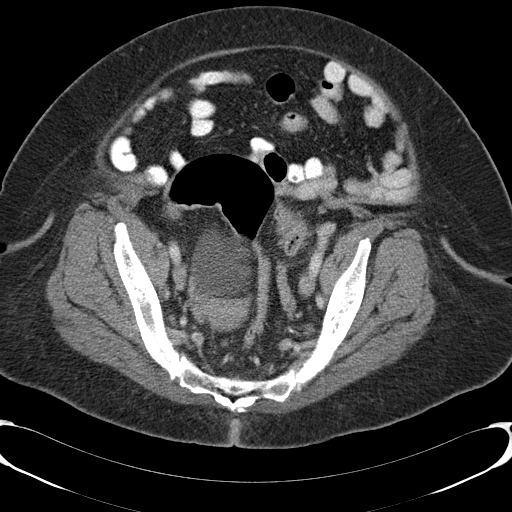
[im 32/81  soft-tissue]
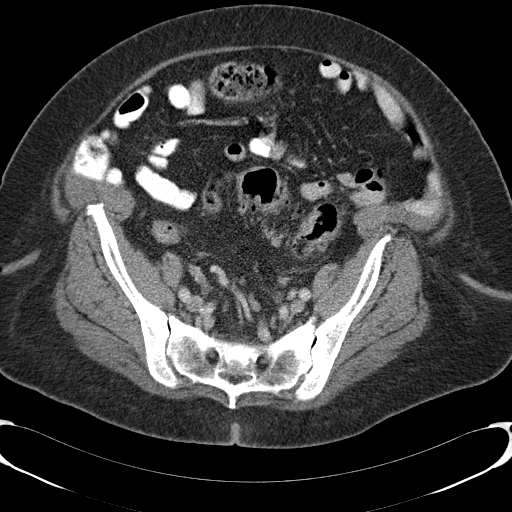
[im 36/81  soft-tissue]
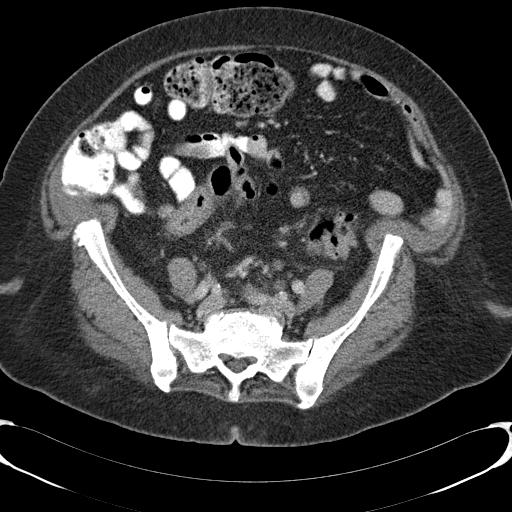
[im 45/81  soft-tissue]
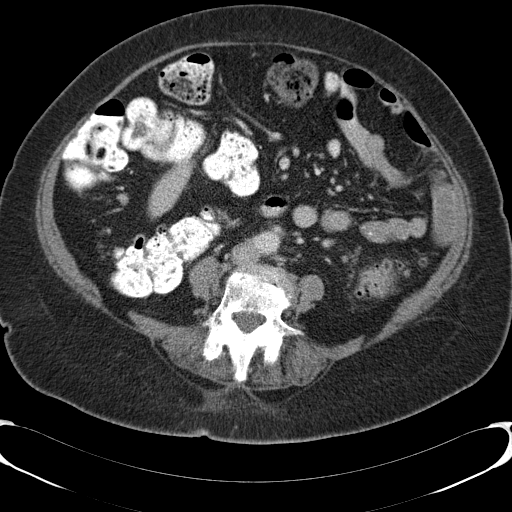
[im 49/81  soft-tissue]
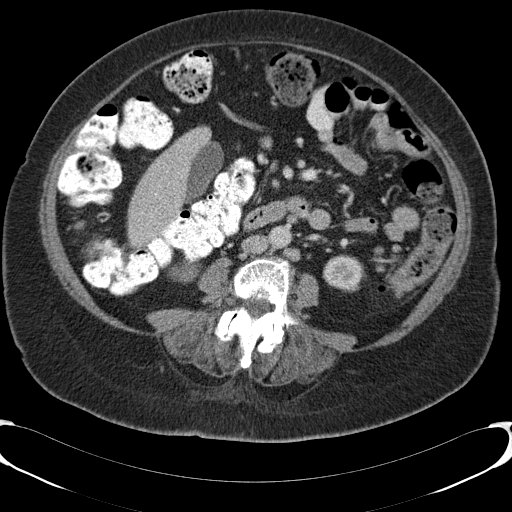
[im 49/81  bone]
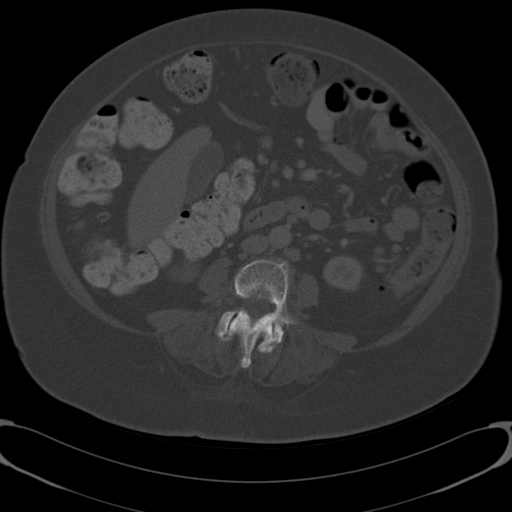
[im 54/81  soft-tissue]
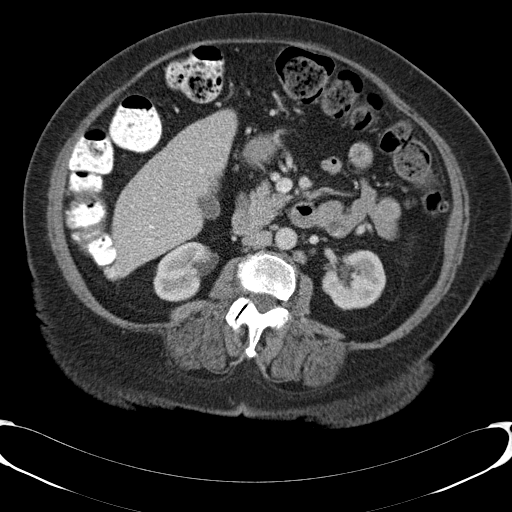
[im 58/81  soft-tissue]
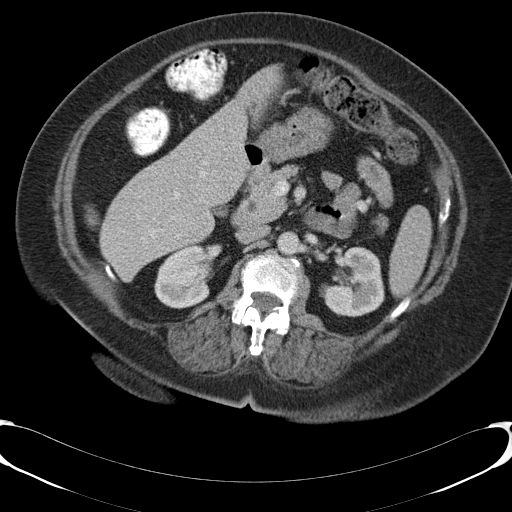
[im 63/81  soft-tissue]
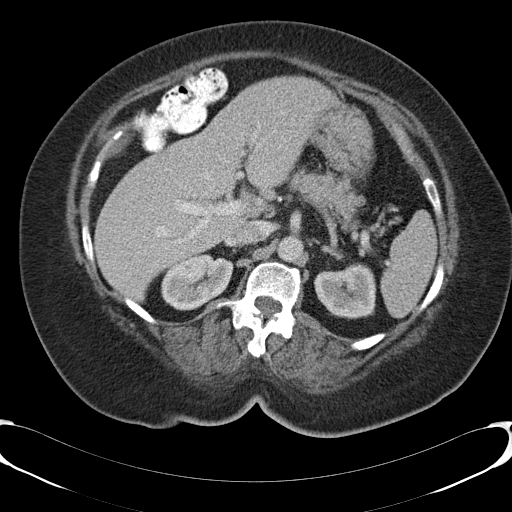
[im 72/81  soft-tissue]
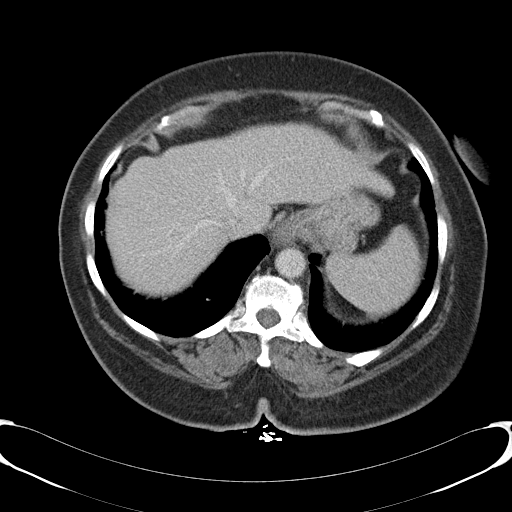
[im 76/81  soft-tissue]
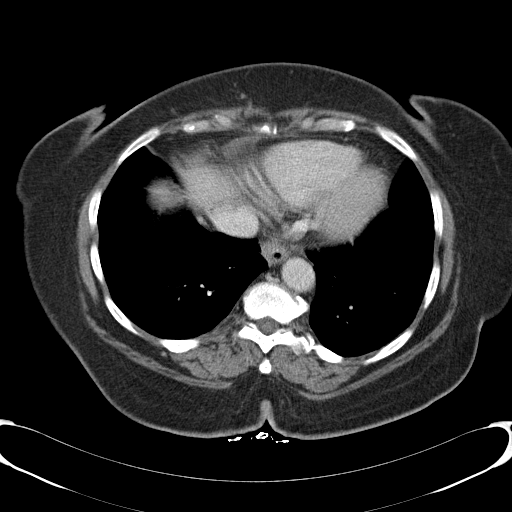

[Series 5: coronal · coronal · 0.79mm/px · 3 of 164 slices shown]
[im 55/164  soft-tissue]
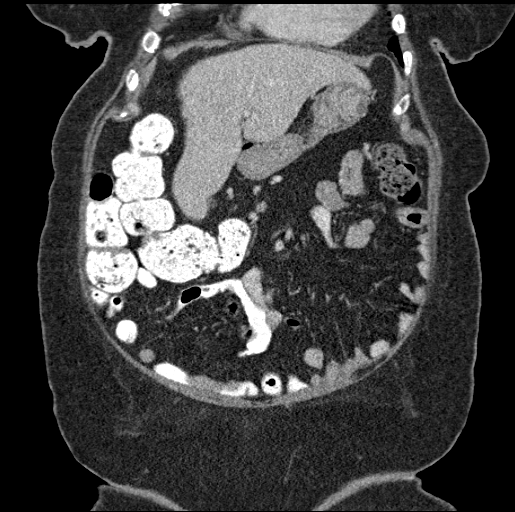
[im 73/164  soft-tissue]
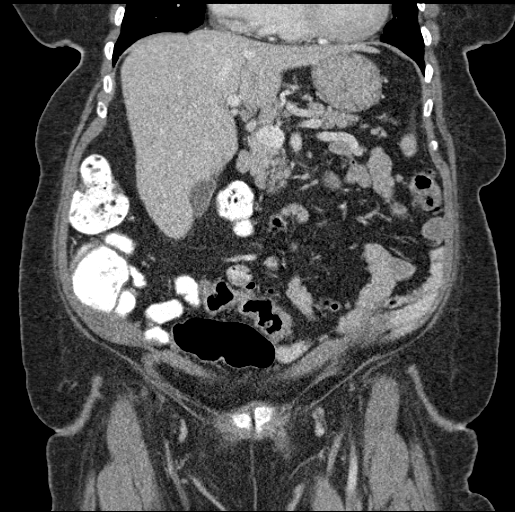
[im 91/164  soft-tissue]
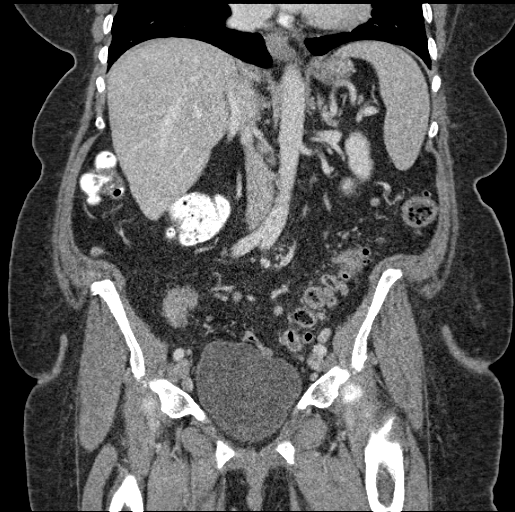

[17 of 46 positions shown; findings below may reference images not displayed]

FINDINGS: Lower chest: Mild cardiomegaly. Lung bases are clear. No effusions.

Hepatobiliary: No focal hepatic abnormality. Gallbladder
unremarkable.

Pancreas: No focal abnormality or ductal dilatation.

Spleen: No focal abnormality.  Normal size.

Adrenals/Urinary Tract: No adrenal abnormality. No focal renal
abnormality. No stones or hydronephrosis. Urinary bladder is
unremarkable.

Stomach/Bowel: Sigmoid and descending colonic diverticulosis. No
active diverticulitis. Stomach and small bowel are decompressed.

Vascular/Lymphatic: Aorto iliac vessels are normal caliber. No
retroperitoneal or mesenteric adenopathy. Mildly prominent bilateral
inguinal lymph nodes. These are borderline based on short axis
diameter, approximately 10 mm.

Reproductive: Uterus and adnexa unremarkable.  No mass.

Other: No free fluid or free air.

Musculoskeletal: Degenerative disc and facet disease throughout the
lumbar spine. Grade 1 anterolisthesis of L5 on S1. No acute bony
abnormality.
IMPRESSION: Left colonic diverticulosis.  No active diverticulitis.

Borderline sized bilateral inguinal lymph nodes. No adenopathy
elsewhere within the abdomen or pelvis.

## 2017-11-09 ENCOUNTER — Encounter: Payer: Self-pay | Admitting: Internal Medicine

## 2017-11-09 ENCOUNTER — Ambulatory Visit (INDEPENDENT_AMBULATORY_CARE_PROVIDER_SITE_OTHER): Payer: Medicare Other | Admitting: Internal Medicine

## 2017-11-09 VITALS — BP 136/78 | HR 52 | Ht 65.0 in | Wt 220.0 lb

## 2017-11-09 DIAGNOSIS — I5032 Chronic diastolic (congestive) heart failure: Secondary | ICD-10-CM

## 2017-11-09 DIAGNOSIS — I10 Essential (primary) hypertension: Secondary | ICD-10-CM

## 2017-11-09 DIAGNOSIS — E782 Mixed hyperlipidemia: Secondary | ICD-10-CM

## 2017-11-09 NOTE — Patient Instructions (Signed)
Your physician recommends that you schedule a follow-up appointment in: November with Dr.Ross     Your physician recommends that you continue on your current medications as directed. Please refer to the Current Medication list given to you today.    If you need a refill on your cardiac medications before your next appointment, please call your pharmacy.      No lab work or tests ordered today.    I called your PCP to get your recent lab work.      Thank you for choosing [facility] !

## 2017-11-09 NOTE — Progress Notes (Signed)
Cardiology Office Note   Date:  11/09/2017   ID:  Ann Boone, DOB November 03, 1940, MRN [id number]  PCP:  Alain Honey, FNP  Cardiologist:   Dietrich Pates, MD   Pt presents for f/u of diastolic CHF      History of Present Illness: Ann Boone is a 77 y.o. female with a history of edema, diastoolic CHF.  I saw her in Oct 2018   Since seen she has done OK   Breathing is good   No CP    Still with some LE edema   Mild    Doesn't walk much  "I have no excuse"  No outpatient medications have been marked as taking for the 11/09/17 encounter (Office Visit) with Pricilla Riffle, MD.     Allergies:   Clindamycin/lincomycin   Past Medical History:  Diagnosis Date  . Asthma   . CHF (congestive heart failure) (HCC)   . COPD (chronic obstructive pulmonary disease) (HCC) 05/18/2015  . Diabetes mellitus without complication (HCC)   . Hypertension   . Mixed hyperlipidemia     Past Surgical History:  Procedure Laterality Date  . CATARACT EXTRACTION    . episiotomy repair       Social History:  The patient  reports that she quit smoking about 34 years ago. Her smoking use included cigarettes. She has never used smokeless tobacco. She reports that she does not drink alcohol or use drugs.   Family History:  The patient's family history includes Alcoholism in her brother; Cancer in her sister; Diabetes in her sister; Glaucoma in her brother and father; Heart disease in her father, maternal grandfather, and sister; Hypertension in her brother and mother; Kidney disease in her mother.    ROS:  Please see the history of present illness. All other systems are reviewed and  Negative to the above problem except as noted.    PHYSICAL EXAM: VS:  BP 136/78   Pulse (!) 52   Ht  (1.651 m)   Wt 220 lb (99.8 kg)   SpO2 97%   BMI 36.61 kg/m   GEN: Morbidly obese 77 yo, in no acute distress  HEENT: normal  Neck: JVP is normal  No  carotid bruits, or masses Cardiac: RRR; no murmurs,  rubs, or gallops,   Tr edema  Respiratory:  clear to auscultation bilaterally, normal work of breathing GI: soft, nontender, nondistended, + BS  No hepatomegaly  MS: no deformity Moving all extremities   Skin: warm and dry, no rash Neuro:  Grossly intact   Psych: euthymic mood, full affect   EKG:  EKG not done today     Lipid Panel    Component Value Date/Time   CHOL 119 06/05/2016 0904   TRIG 62 06/05/2016 0904   HDL 49 (L) 06/05/2016 0904   CHOLHDL 2.4 06/05/2016 0904   VLDL 12 06/05/2016 0904   LDLCALC 58 06/05/2016 0904      Wt Readings from Last 3 Encounters:  11/09/17 220 lb (99.8 kg)  05/28/17 219 lb (99.3 kg)  02/12/17 215 lb (97.5 kg)      ASSESSMENT AND PLAN:  1  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  The patient does not have concerns regarding medicines.  Signed, Dietrich Pates, MD  11/09/2017 9:57 AM    [facility] [address] [place], [place], [place]  [address] Phone: [phone]; Fax: [phone]

## 2017-11-22 IMAGING — US US EXTREM LOW VENOUS*L*
1 series · 14 of 24 positions shown · non-contrast
Comparison: None.

CLINICAL DATA: Lower extremity pain and swelling

EXAM:
LEFT LOWER EXTREMITY VENOUS DUPLEX ULTRASOUND
TECHNIQUE: Doppler venous assessment of the left lower extremity deep venous
system was performed, including characterization of spectral flow,
compressibility, and phasicity.

[Series 1: us extrem low venous*left* · 0.09mm/px · 14 of 35 slices shown]
[im 1/35]
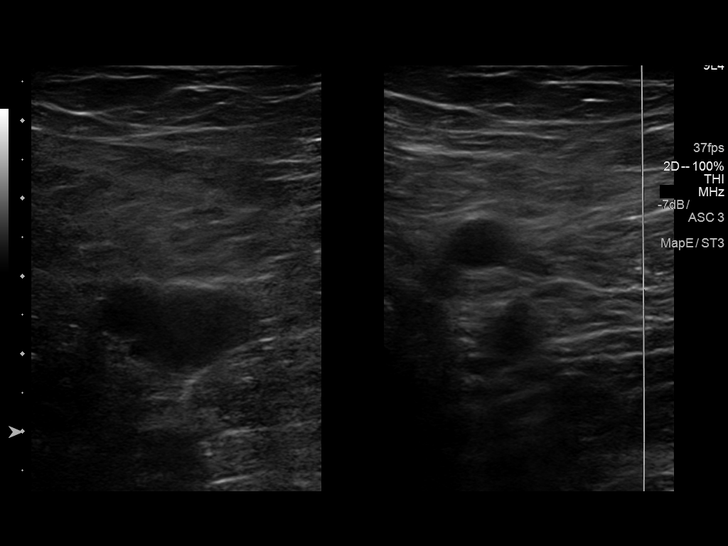
[im 3/35]
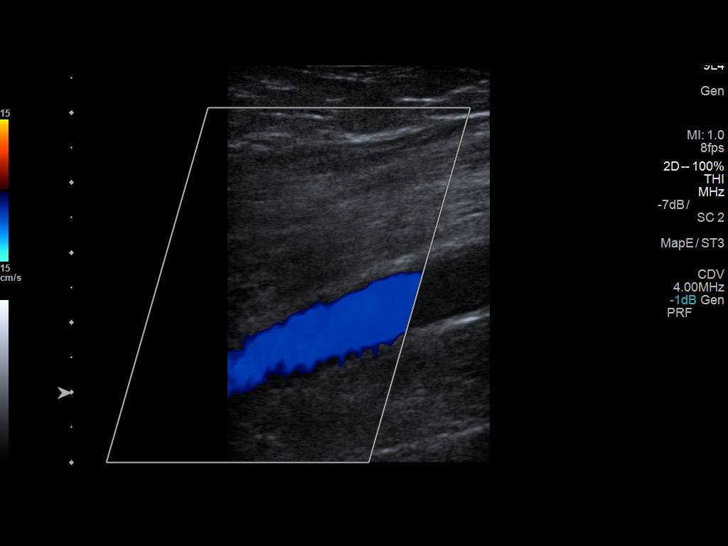
[im 6/35]
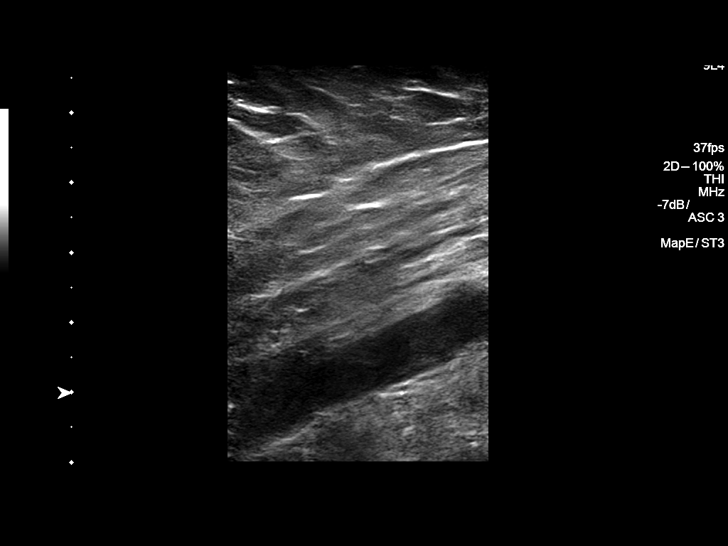
[im 9/35]
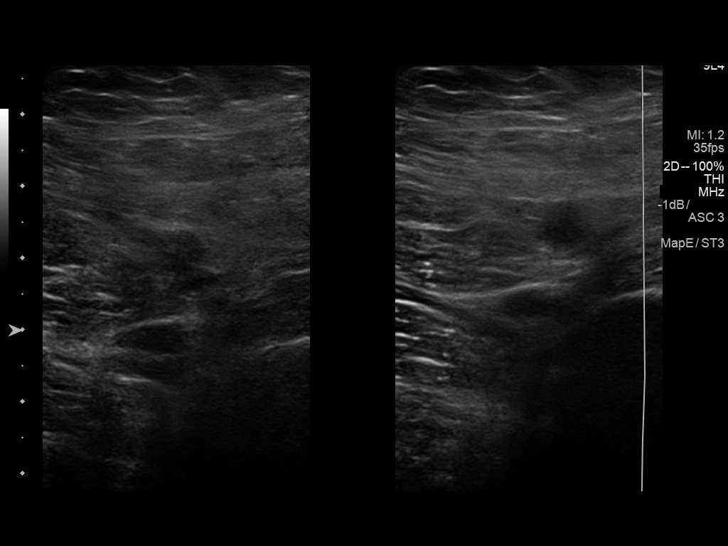
[im 11/35]
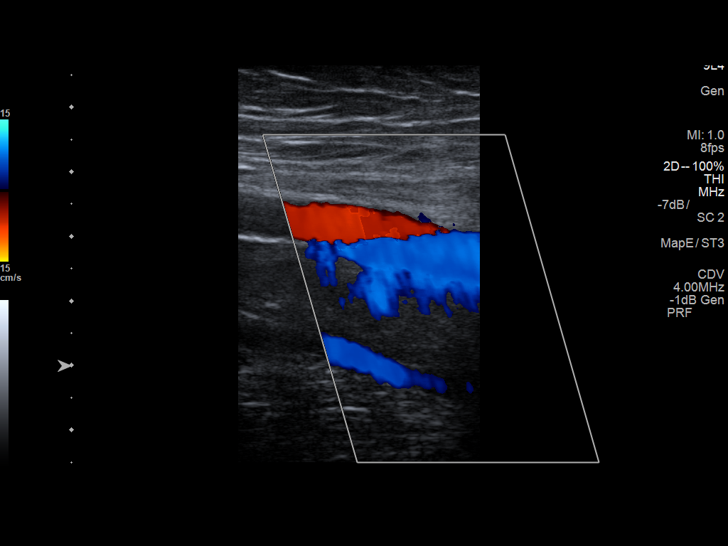
[im 14/35]
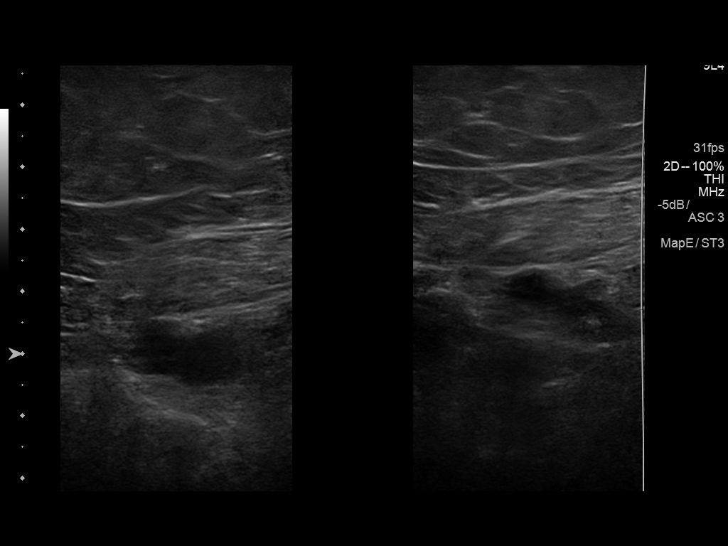
[im 17/35]
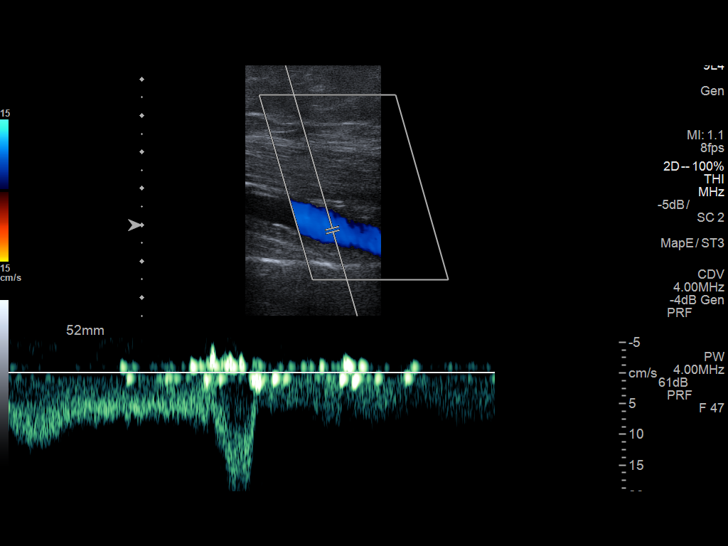
[im 18/35]
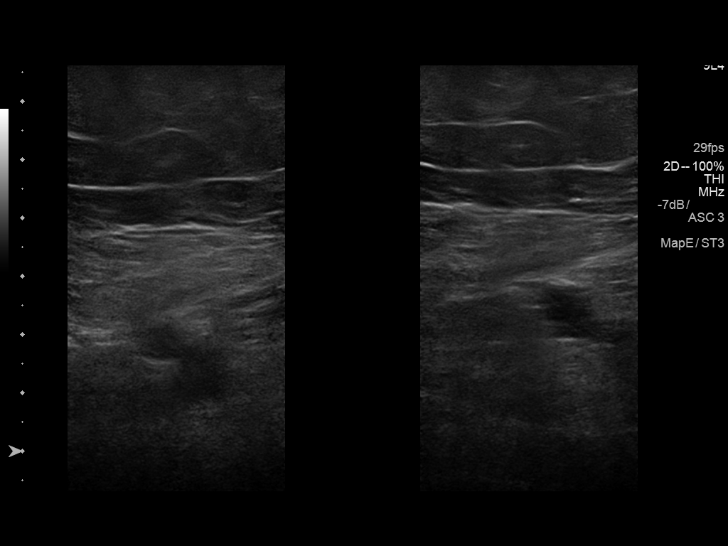
[im 21/35]
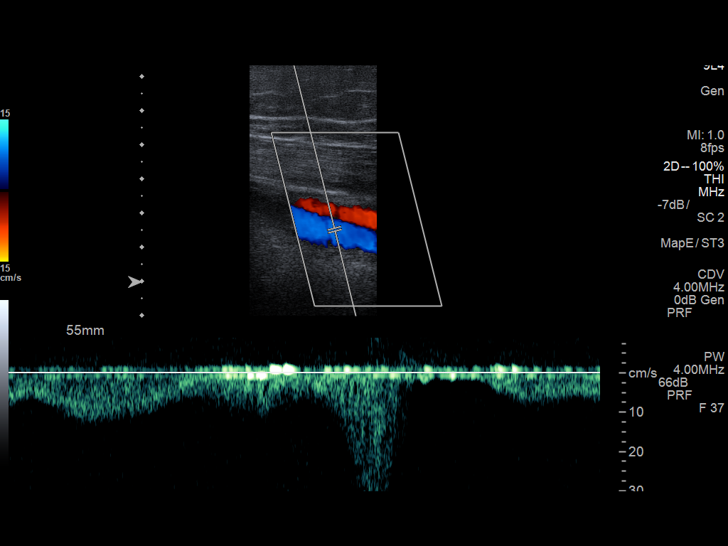
[im 24/35]
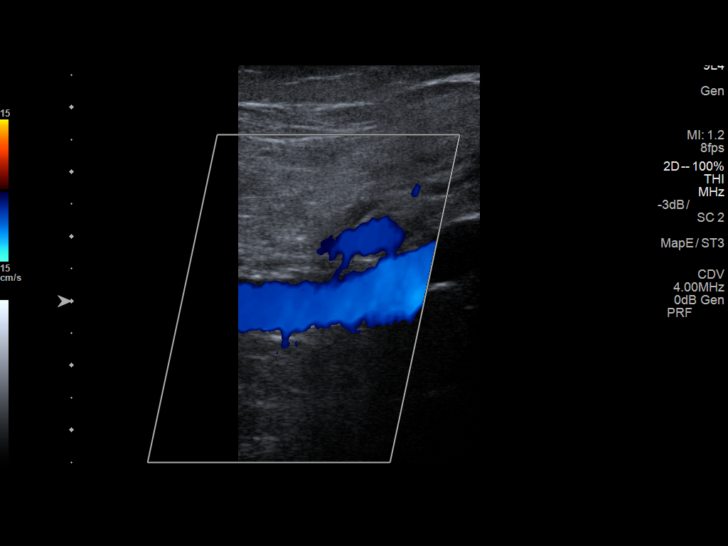
[im 27/35]
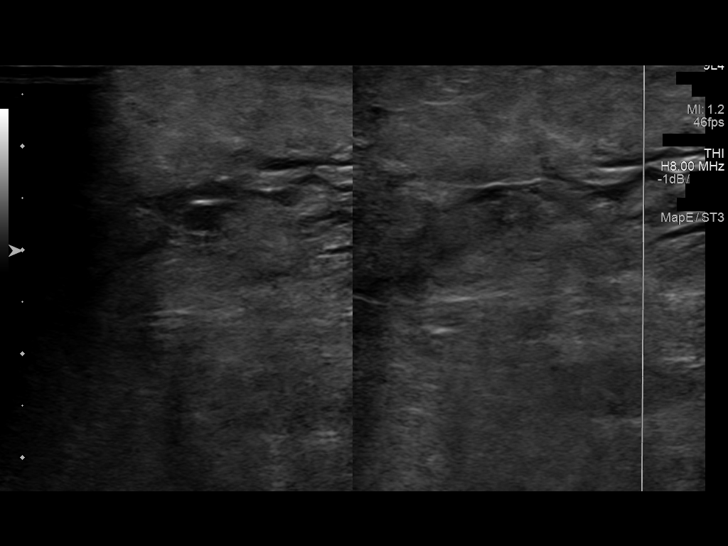
[im 29/35]
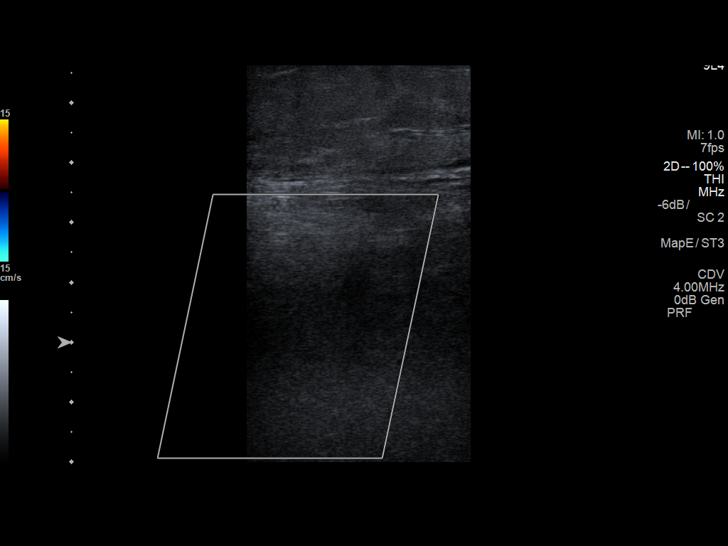
[im 32/35]
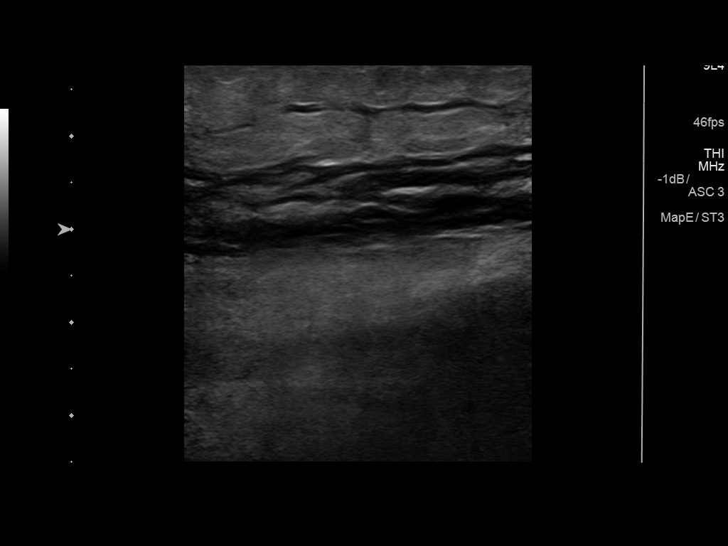
[im 35/35]
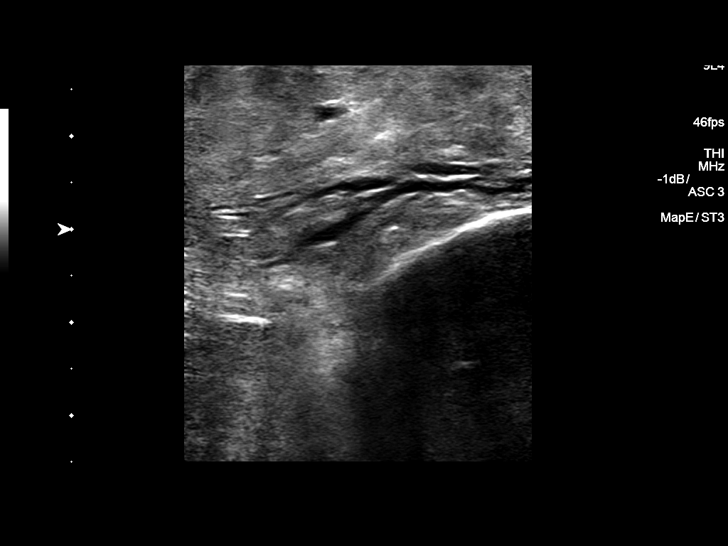

[14 of 24 positions shown; findings below may reference images not displayed]

FINDINGS: There is complete compressibility of the left common femoral,
femoral, and popliteal veins. Doppler analysis demonstrates
respiratory phasicity and augmentation of flow upon calf
compression. Edema of the subcutaneous fat in the calf is noted.
IMPRESSION: No evidence of left lower extremity DVT.

## 2018-03-28 IMAGING — US US EXTREM LOW VENOUS*L*
1 series · 14 of 24 positions shown · non-contrast
Comparison: None.

CLINICAL DATA: Left leg edema

EXAM:
LEFT LOWER EXTREMITY VENOUS DUPLEX ULTRASOUND
TECHNIQUE: Doppler venous assessment of the left lower extremity deep venous
system was performed, including characterization of spectral flow,
compressibility, and phasicity.

[Series 1: us extrem low venous*left* · 0.08mm/px · 14 of 39 slices shown]
[im 1/39]
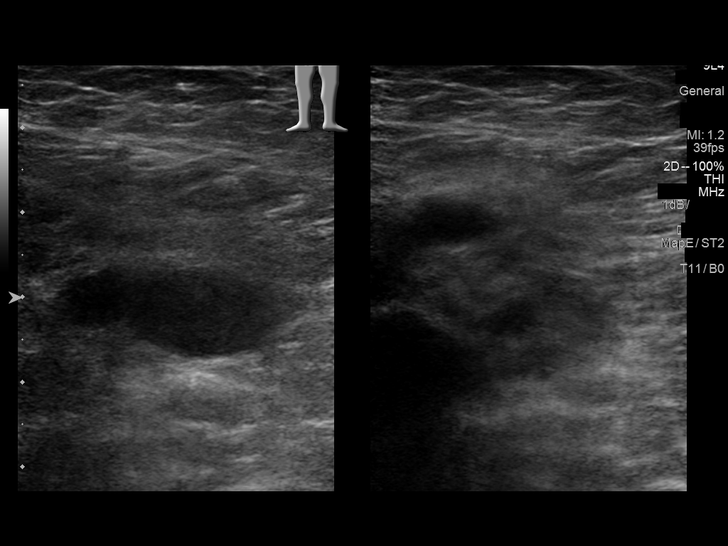
[im 4/39]
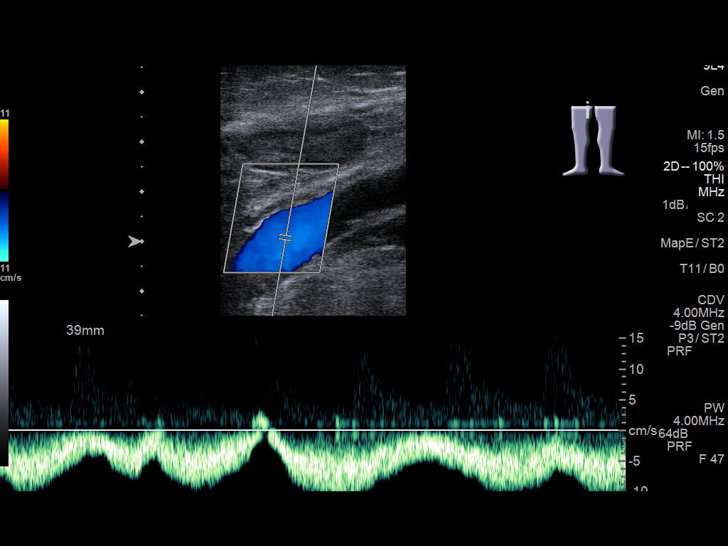
[im 7/39]
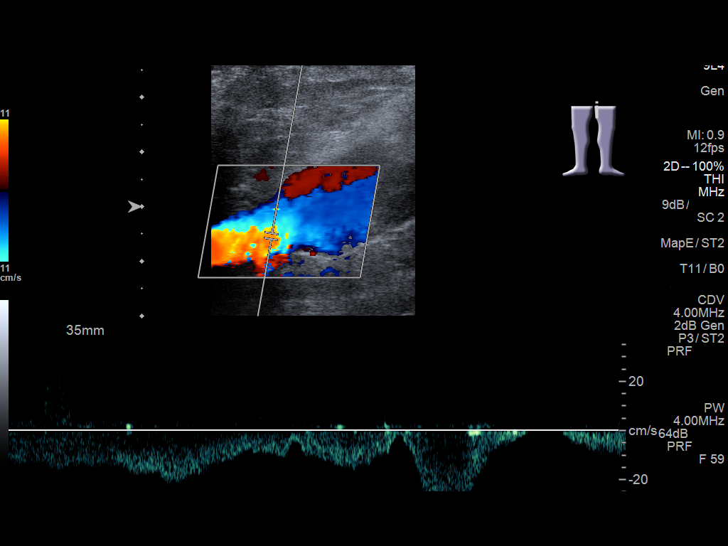
[im 10/39]
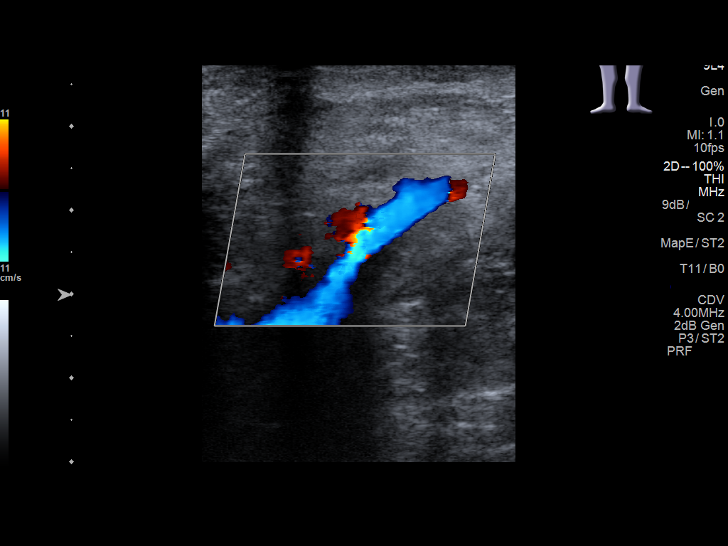
[im 12/39]
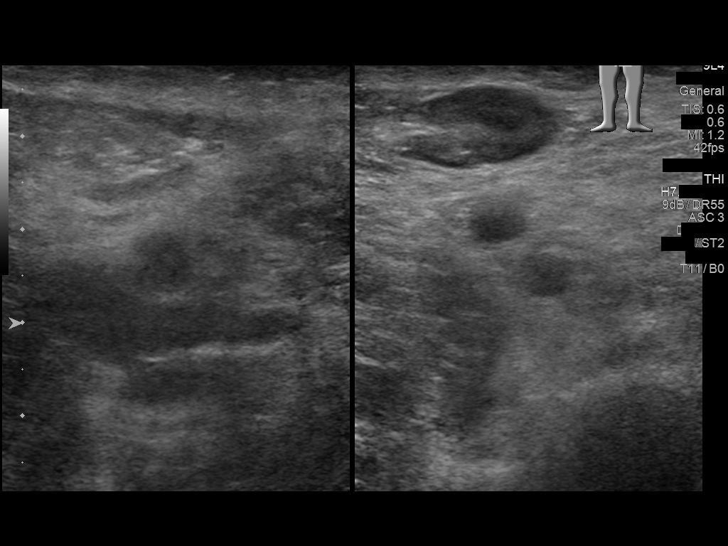
[im 15/39]
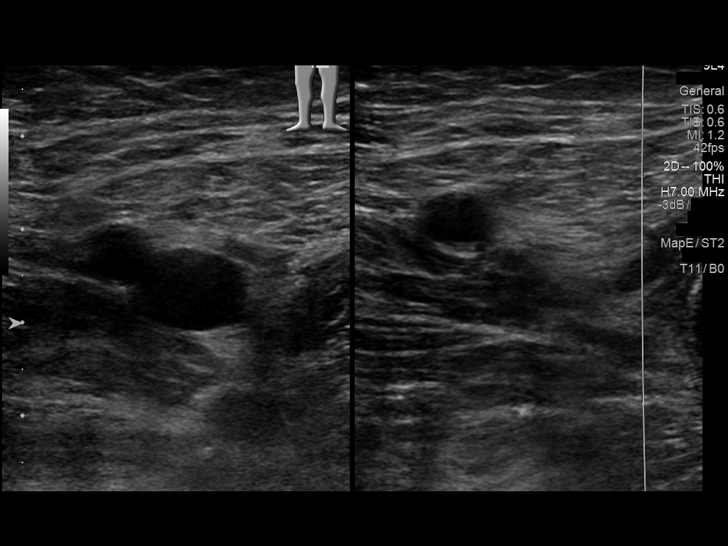
[im 19/39]
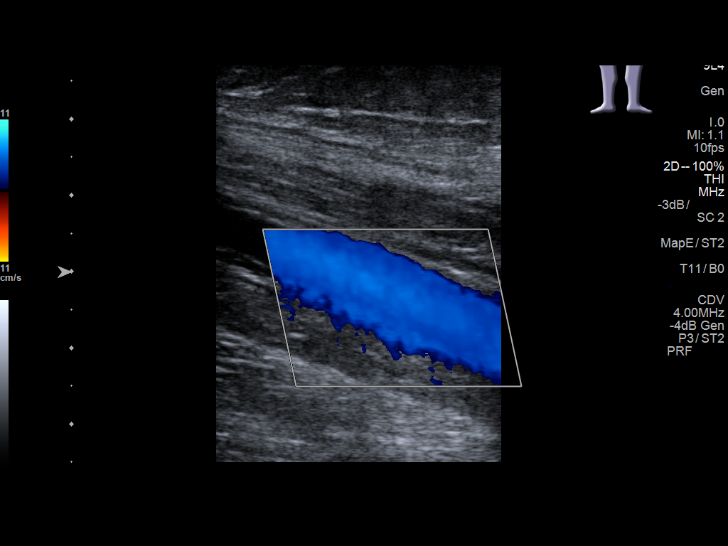
[im 20/39]
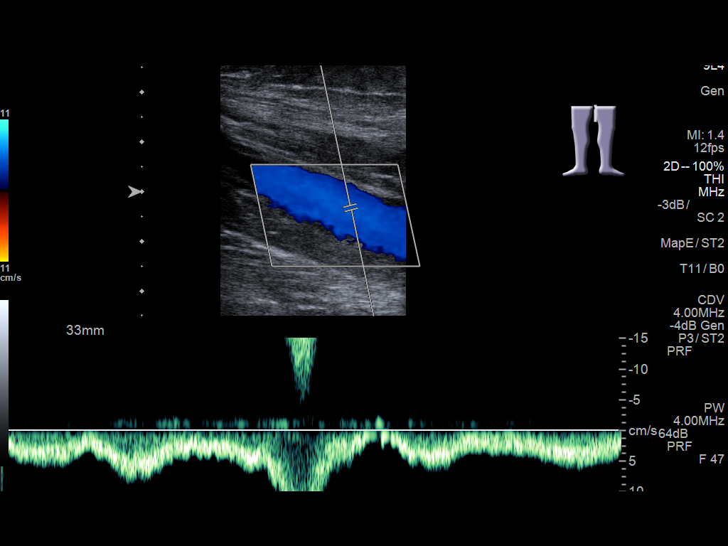
[im 24/39]
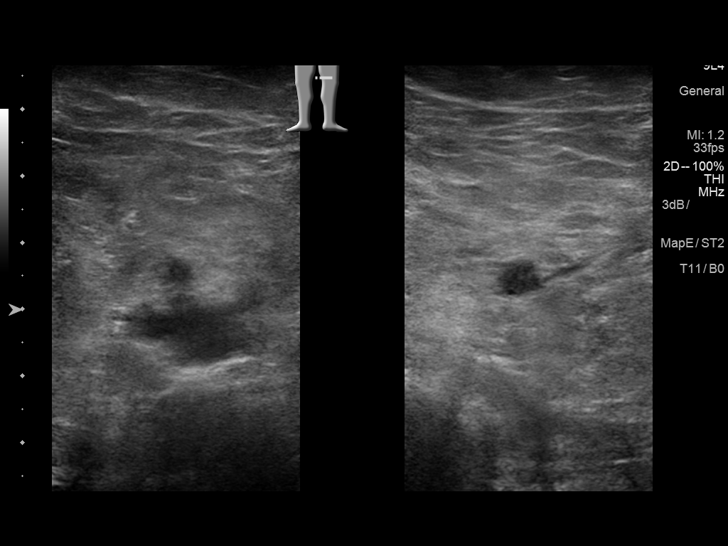
[im 27/39]
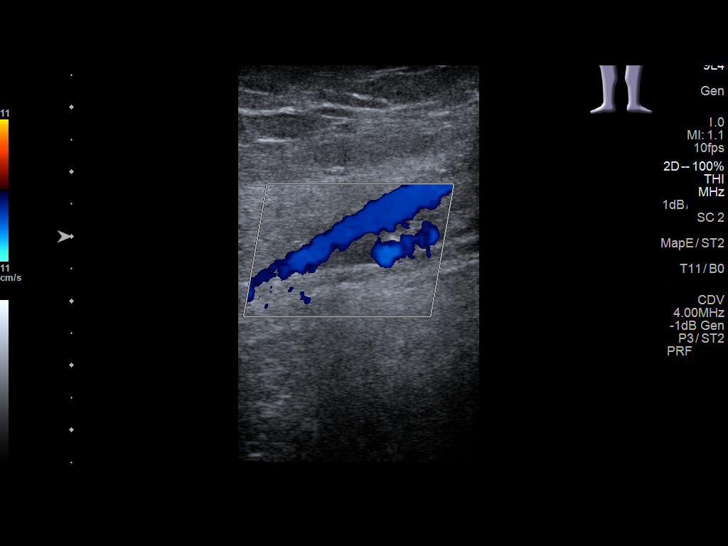
[im 30/39]
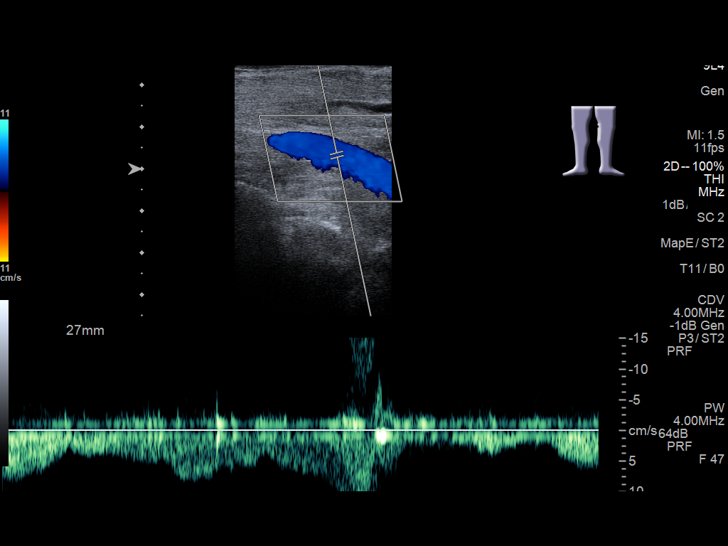
[im 32/39]
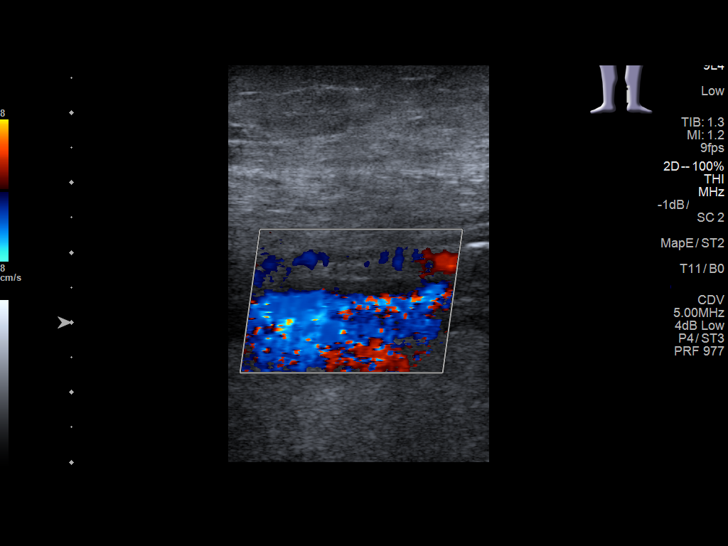
[im 35/39]
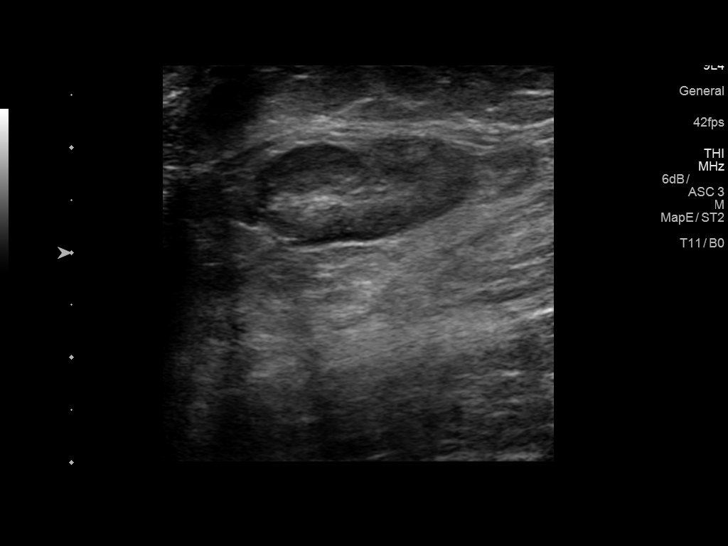
[im 39/39]
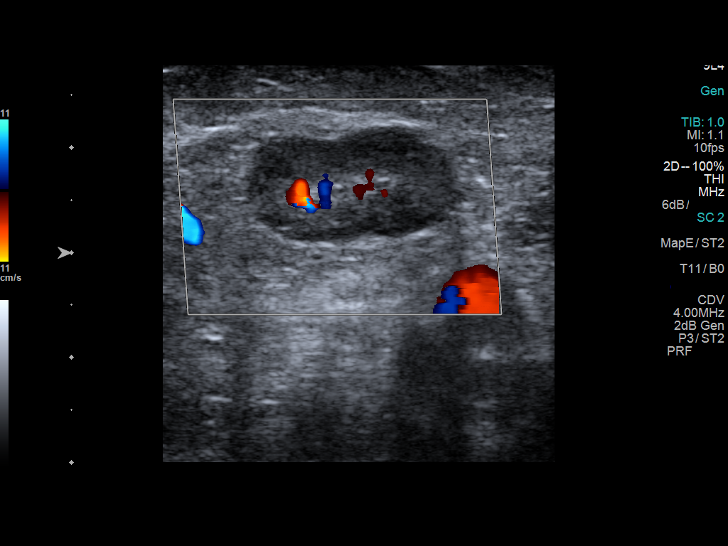

[14 of 24 positions shown; findings below may reference images not displayed]

FINDINGS: There is complete compressibility of the left common femoral,
femoral, and popliteal veins. Doppler analysis demonstrates
respiratory phasicity and augmentation of flow with calf
compression. No obvious superficial vein or calf vein thrombosis.

There is a mildly enlarged left inguinal lymph node with a short
axis diameter of 1.0 cm.
IMPRESSION: No evidence of left lower extremity DVT.

Mildly enlarged left inguinal lymph node. Differential diagnosis
includes inflammatory disease and malignancy. Correlate clinically
as for the need for imaging follow-up.

## 2018-05-01 ENCOUNTER — Other Ambulatory Visit: Payer: Self-pay | Admitting: Internal Medicine

## 2018-08-02 ENCOUNTER — Encounter: Payer: Self-pay | Admitting: Internal Medicine

## 2018-08-02 ENCOUNTER — Other Ambulatory Visit (HOSPITAL_COMMUNITY)
Admission: RE | Admit: 2018-08-02 | Discharge: 2018-08-02 | Disposition: A | Discharge status: Home / Self Care | Payer: Medicare HMO | Source: Ambulatory Visit | Attending: Internal Medicine | Admitting: Internal Medicine

## 2018-08-02 ENCOUNTER — Ambulatory Visit (INDEPENDENT_AMBULATORY_CARE_PROVIDER_SITE_OTHER): Payer: Medicare HMO | Admitting: Internal Medicine

## 2018-08-02 VITALS — BP 142/64 | HR 65 | Ht 64.0 in | Wt 221.0 lb

## 2018-08-02 DIAGNOSIS — R002 Palpitations: Secondary | ICD-10-CM | POA: Diagnosis present

## 2018-08-02 DIAGNOSIS — E782 Mixed hyperlipidemia: Secondary | ICD-10-CM

## 2018-08-02 DIAGNOSIS — Z79899 Other long term (current) drug therapy: Secondary | ICD-10-CM | POA: Insufficient documentation

## 2018-08-02 DIAGNOSIS — N183 Chronic kidney disease, stage 3 unspecified: Secondary | ICD-10-CM

## 2018-08-02 LAB — CBC WITH DIFFERENTIAL/PLATELET
Abs Immature Granulocytes: 0.01 10*3/uL (ref 0.00–0.07)
Basophils Absolute: 0 10*3/uL (ref 0.0–0.1)
Basophils Relative: 1 %
EOS ABS: 0.1 10*3/uL (ref 0.0–0.5)
Eosinophils Relative: 3 %
HCT: 36.8 % (ref 36.0–46.0)
Hemoglobin: 10.8 g/dL — ABNORMAL LOW (ref 12.0–15.0)
Immature Granulocytes: 0 %
Lymphocytes Relative: 38 %
Lymphs Abs: 1.7 10*3/uL (ref 0.7–4.0)
MCH: 25 pg — ABNORMAL LOW (ref 26.0–34.0)
MCHC: 29.3 g/dL — AB (ref 30.0–36.0)
MCV: 85.2 fL (ref 80.0–100.0)
Monocytes Absolute: 0.5 10*3/uL (ref 0.1–1.0)
Monocytes Relative: 10 %
Neutro Abs: 2.2 10*3/uL (ref 1.7–7.7)
Neutrophils Relative %: 48 %
Platelets: 185 10*3/uL (ref 150–400)
RBC: 4.32 MIL/uL (ref 3.87–5.11)
RDW: 15 % (ref 11.5–15.5)
WBC: 4.6 10*3/uL (ref 4.0–10.5)
nRBC: 0 % (ref 0.0–0.2)

## 2018-08-02 LAB — LIPID PANEL
Cholesterol: 134 mg/dL (ref 0–200)
HDL: 47 mg/dL (ref >40)
LDL Cholesterol: 76 mg/dL (ref 0–99)
Total CHOL/HDL Ratio: 2.9 RATIO
Triglycerides: 53 mg/dL (ref <150)
VLDL: 11 mg/dL (ref 0–40)

## 2018-08-02 LAB — BASIC METABOLIC PANEL
Anion gap: 8 (ref 5–15)
BUN: 18 mg/dL (ref 8–23)
CO2: 29 mmol/L (ref 22–32)
Calcium: 9.1 mg/dL (ref 8.9–10.3)
Chloride: 105 mmol/L (ref 98–111)
Creatinine, Ser: 1.15 mg/dL — ABNORMAL HIGH (ref 0.44–1.00)
GFR, EST AFRICAN AMERICAN: 54 mL/min — AB (ref >60)
GFR, EST NON AFRICAN AMERICAN: 46 mL/min — AB (ref >60)
Glucose, Bld: 112 mg/dL — ABNORMAL HIGH (ref 70–99)
Potassium: 4.4 mmol/L (ref 3.5–5.1)
Sodium: 142 mmol/L (ref 135–145)

## 2018-08-02 LAB — BRAIN NATRIURETIC PEPTIDE: B Natriuretic Peptide: 22 pg/mL (ref 0.0–100.0)

## 2018-08-02 NOTE — Progress Notes (Signed)
Cardiology Office Note   Date:  08/02/2018   ID:  Ann Boone, DOB 08/25/1940, MRN [id number]  PCP:  Altamease OilerHarris, Meredith L, FNP  Cardiologist:   Dietrich PatesPaula Gleen Ripberger, MD   Pt presents  f/u of diastolic CHF      History of Present Illness: Ann Boone is a 78 y.o. female with a history of edema, diastoolic CHF. I saw her in April 2019   Since seen she says her breathing is OK   Denies CP    She says her ankles and feet have intermitt swelling    Does note more salt intake over the holidays   Denies PND     Current Meds  Medication Sig  . allopurinol (ZYLOPRIM) 300 MG tablet Take 300 mg by mouth daily.  . benazepril (LOTENSIN) 20 MG tablet Take 20 mg by mouth daily.  . carvedilol (COREG) 12.5 MG tablet Take 12.5 mg by mouth 2 (two) times daily with a meal.  . cetirizine (ZYRTEC) 10 MG tablet Take 10 mg by mouth at bedtime.  . colchicine 0.6 MG tablet Take 0.6 mg by mouth 2 (two) times daily as needed. For gout flare-ups  . ferrous sulfate 325 (65 FE) MG tablet Take 325 mg by mouth every evening.   . fluticasone (FLONASE) 50 MCG/ACT nasal spray Place 1-2 sprays into both nostrils daily as needed for rhinitis.  . Fluticasone-Salmeterol (ADVAIR) 250-50 MCG/DOSE AEPB Inhale 1 puff into the lungs 2 (two) times daily.  . furosemide (LASIX) 40 MG tablet Take 1 tablet once weekly for weight gain or extra fluid retention.  . furosemide (LASIX) 40 MG tablet TAKE 1/2 TABLET BY MOUTH EVERYDAY EXCEPT MONDAY AND THURSDAY. TAKE ONE TABLET BY MOUTH ON THOSE DAYS  . gabapentin (NEURONTIN) 300 MG capsule Take 300 mg by mouth 3 (three) times daily.  Marland Kitchen. glipiZIDE (GLUCOTROL) 5 MG tablet Take 5 mg by mouth 2 (two) times daily.  Marland Kitchen. ipratropium (ATROVENT HFA) 17 MCG/ACT inhaler Inhale 2 puffs into the lungs 2 (two) times daily.  Marland Kitchen. lovastatin (MEVACOR) 40 MG tablet Take 40 mg by mouth at bedtime.  . Melatonin 3 MG TABS Take 3 mg by mouth at bedtime as needed. For sleep  . montelukast (SINGULAIR) 10 MG tablet Take  10 mg by mouth at bedtime.  Marland Kitchen. omeprazole (PRILOSEC) 20 MG capsule Take 20 mg by mouth daily.  . traMADol (ULTRAM) 50 MG tablet Take 50 mg by mouth every 6 (six) hours as needed for moderate pain.  . Vitamin D, Ergocalciferol, (DRISDOL) 50000 units CAPS capsule Take 50,000 Units by mouth every 30 (thirty) days.     Allergies:   Clindamycin/lincomycin   Past Medical History:  Diagnosis Date  . Asthma   . CHF (congestive heart failure) (HCC)   . COPD (chronic obstructive pulmonary disease) (HCC) 05/18/2015  . Diabetes mellitus without complication (HCC)   . Hypertension   . Mixed hyperlipidemia     Past Surgical History:  Procedure Laterality Date  . CATARACT EXTRACTION    . episiotomy repair       Social History:  The patient  reports that she quit smoking about 34 years ago. Her smoking use included cigarettes. She has never used smokeless tobacco. She reports that she does not drink alcohol or use drugs.   Family History:  The patient's family history includes Alcoholism in her brother; Cancer in her sister; Diabetes in her sister; Glaucoma in her brother and father; Heart disease in her father, maternal grandfather, and sister;  Hypertension in her brother and mother; Kidney disease in her mother.    ROS:  Please see the history of present illness. All other systems are reviewed and  Negative to the above problem except as noted.    PHYSICAL EXAM: VS:  BP (!) 142/64   Pulse 65   Ht 5\' 4"  (1.626 m)   Wt 221 lb (100.2 kg)   SpO2 94%   BMI 37.93 kg/m   GEN: Morbidly obese 78 yo, in no acute distress  HEENT: normal  Neck: JVP is not elevated   No  carotid bruits, or masses Cardiac: RRR; no murmurs, rubs, or gallops,   Tr edema (support socks on) Respiratory:  clear to auscultation bilaterally, normal work of breathing GI: soft, nontender, nondistended, + BS  No hepatomegaly  MS: no deformity Moving all extremities   Skin: warm and dry, no rash Neuro:  Grossly intact     Psych: euthymic mood, full affect   EKG:  EKG  done today    SSinus bradycardia  56 bpm   Low voltage in anterior precordium   Poor R wave progression     Lipid Panel    Component Value Date/Time   CHOL 119 06/05/2016 0904   TRIG 62 06/05/2016 0904   HDL 49 (L) 06/05/2016 0904   CHOLHDL 2.4 06/05/2016 0904   VLDL 12 06/05/2016 0904   LDLCALC 58 06/05/2016 0904      Wt Readings from Last 3 Encounters:  08/02/18 221 lb (100.2 kg)  11/09/17 220 lb (99.8 kg)  05/28/17 219 lb (99.3 kg)      ASSESSMENT AND PLAN:  1  Chronic diastlic CHF  Volume is not bad now, up mildly    I would follow   Watch salt intake    Can take an additional lasix PRN Check BMET and BNP today    2  HTN  BP minimally increased  Keep on same regimen  3    HL   Will get labs today     F/U at the end of the summer        Current medicines are reviewed at length with the patient today.  The patient does not have concerns regarding medicines.  Signed, Dietrich Pates, MD  08/02/2018 1:48 PM    [facility] [address] [place], [place], [place]  [address] Phone: [phone]; Fax: [phone]

## 2018-08-02 NOTE — Patient Instructions (Signed)
Medication Instructions:  Your physician recommends that you continue on your current medications as directed. Please refer to the Current Medication list given to you today.  If you need a refill on your cardiac medications before your next appointment, please call your pharmacy.   Lab work: Your physician recommends that you return for lab work in: Today   If you have labs (blood work) drawn today and your tests are completely normal, you will receive your results only by: . MyChart Message (if you have MyChart) OR . A paper copy in the mail If you have any lab test that is abnormal or we need to change your treatment, we will call you to review the results.  Testing/Procedures: NONE   Follow-Up: At [facility], you and your health needs are our priority.  As part of our continuing mission to provide you with exceptional heart care, we have created designated Provider Care Teams.  These Care Teams include your primary Cardiologist (physician) and Advanced Practice Providers (APPs -  Physician Assistants and Nurse Practitioners) who all work together to provide you with the care you need, when you need it. You will need a follow up appointment in 6 months.  Please call our office 2 months in advance to schedule this appointment.  You may see Paula Ross, MD or one of the following Advanced Practice Providers on your designated Care Team:   Brittany Strader, PA-C ([place] Office) . Michele Lenze, PA-C ([place] Office)  Any Other Special Instructions Will Be Listed Below (If Applicable). Thank you for choosing [facility]!     

## 2018-11-02 ENCOUNTER — Other Ambulatory Visit: Payer: Self-pay | Admitting: Internal Medicine

## 2019-03-26 NOTE — Progress Notes (Signed)
Cardiology Office Note   Date:  03/27/2019   ID:  Ann Boone, DOB 11-23-1940, MRN [id number]  PCP:  Joyice Faster, FNP  Cardiologist:   Dorris Carnes, MD   Pt presents  f/u of diastolic CHF      History of Present Illness: Ann Boone is a 78 y.o. female with a history of edema, diastoolic CHF. I saw her in Jan 2020  At that time I thought she had some extra fluid (holiday eating) The pt says she had been doing OK until about 2 wks ago   She says then she started noticing and "indigestion" or pressure sensation in chest   Comes and goes     No bitter taste.  No burping   No change with eating   She also notes increased SOB with exertion.      No PND  Chronic 3 pillow orthopnea Does not add salt to food   Eats out about 1 time per week  No recent illness      Current Meds  Medication Sig  . allopurinol (ZYLOPRIM) 300 MG tablet Take 300 mg by mouth daily.  . benazepril (LOTENSIN) 20 MG tablet Take 20 mg by mouth daily.  . carvedilol (COREG) 12.5 MG tablet Take 12.5 mg by mouth 2 (two) times daily with a meal.  . cetirizine (ZYRTEC) 10 MG tablet Take 10 mg by mouth at bedtime.  . colchicine 0.6 MG tablet Take 0.6 mg by mouth 2 (two) times daily as needed. For gout flare-ups  . ferrous sulfate 325 (65 FE) MG tablet Take 325 mg by mouth every evening.   . fluticasone (FLONASE) 50 MCG/ACT nasal spray Place 1-2 sprays into both nostrils daily as needed for rhinitis.  . Fluticasone-Salmeterol (ADVAIR) 250-50 MCG/DOSE AEPB Inhale 1 puff into the lungs 2 (two) times daily.  . furosemide (LASIX) 40 MG tablet TAKE 1/2 TABLET BY MOUTH EVERYDAY EXCEPT MONDAY AND THURSDAY. TAKE ONE TABLET BY MOUTH ON THOSE DAYS  . gabapentin (NEURONTIN) 300 MG capsule Take 300 mg by mouth 3 (three) times daily.  Marland Kitchen glipiZIDE (GLUCOTROL) 5 MG tablet Take 5 mg by mouth 2 (two) times daily.  Marland Kitchen ipratropium (ATROVENT HFA) 17 MCG/ACT inhaler Inhale 2 puffs into the lungs 2 (two) times daily.  Marland Kitchen lovastatin  (MEVACOR) 40 MG tablet Take 40 mg by mouth at bedtime.  . Melatonin 3 MG TABS Take 3 mg by mouth at bedtime as needed. For sleep  . montelukast (SINGULAIR) 10 MG tablet Take 10 mg by mouth at bedtime.  Marland Kitchen omeprazole (PRILOSEC) 20 MG capsule Take 20 mg by mouth daily.  . traMADol (ULTRAM) 50 MG tablet Take 50 mg by mouth every 6 (six) hours as needed for moderate pain.  . Vitamin D, Ergocalciferol, (DRISDOL) 50000 units CAPS capsule Take 50,000 Units by mouth every 30 (thirty) days.     Allergies:   Clindamycin/lincomycin   Past Medical History:  Diagnosis Date  . Asthma   . CHF (congestive heart failure) ([place])   . COPD (chronic obstructive pulmonary disease) ([place]) 05/18/2015  . Diabetes mellitus without complication ([place])   . Hypertension   . Mixed hyperlipidemia     Past Surgical History:  Procedure Laterality Date  . CATARACT EXTRACTION    . episiotomy repair       Social History:  The patient  reports that she quit smoking about 35 years ago. Her smoking use included cigarettes. She has never used smokeless tobacco. She reports that she does  not drink alcohol or use drugs.   Family History:  The patient's family history includes Alcoholism in her brother; Cancer in her sister; Diabetes in her sister; Glaucoma in her brother and father; Heart disease in her father, maternal grandfather, and sister; Hypertension in her brother and mother; Kidney disease in her mother.    ROS:  Please see the history of present illness. All other systems are reviewed and  Negative to the above problem except as noted.    PHYSICAL EXAM: VS:  BP (!) 150/80 (BP Location: Left Arm)   Pulse 64   Temp (!) 97.5 F (36.4 C)   Ht 5\' 5"  (1.651 m)   Wt 220 lb (99.8 kg)   SpO2 96%   BMI 36.61 kg/m   GEN: Morbidly obese 78 yo, in no acute distress  HEENT: normal  Neck: JVP is not elevated   No  carotid bruits Cardiac: RRR; no murmurs, rubs, or gallops,   Tr to 1+ eema (support socks on)  Respiratory:  clear to auscultation bilaterally, normal work of breathing GI: soft, nontender, nondistended, + BS  No hepatomegaly  MS: no deformity Moving all extremities   Skin: warm and dry, no rash Neuro:  Grossly intact   Psych: euthymic mood, full affect   EKG:  EKG is not   done today     Lipid Panel    Component Value Date/Time   CHOL 134 08/02/2018 1446   TRIG 53 08/02/2018 1446   HDL 47 08/02/2018 1446   CHOLHDL 2.9 08/02/2018 1446   VLDL 11 08/02/2018 1446   LDLCALC 76 08/02/2018 1446      Wt Readings from Last 3 Encounters:  03/27/19 220 lb (99.8 kg)  08/02/18 221 lb (100.2 kg)  11/09/17 220 lb (99.8 kg)      ASSESSMENT AND PLAN: 1  Chest discomfort   Vague   I am not convinnced angina but difficut to tease out   Does note increase DOE    On exam, volume is up a little    REcomm:  Will get CBC, BMET, BNP and TSH     WOuld set up for repeat echo to reeval LVEF    Would also set up for lexiscan myovue  1  Chronic diastlic CHF  Volume is not bad now, up mildly    I would follow   Watch salt intake    2  HTN  BP mildly increased  Check labs  No change for right now unitl reviewed  3    HL   Continue lovastatin  F?U will be based on test results        Current medicines are reviewed at length with the patient today.  The patient does not have concerns regarding medicines.  Signed, Dietrich PatesPaula Irena Gaydos, MD  03/27/2019 1:22 PM    [facility] [address], [place], [place]  [address] Phone: [phone]; Fax: [phone]

## 2019-03-27 ENCOUNTER — Encounter: Payer: Self-pay | Admitting: *Deleted

## 2019-03-27 ENCOUNTER — Ambulatory Visit (INDEPENDENT_AMBULATORY_CARE_PROVIDER_SITE_OTHER): Payer: Medicare HMO | Admitting: Internal Medicine

## 2019-03-27 ENCOUNTER — Other Ambulatory Visit: Payer: Self-pay

## 2019-03-27 ENCOUNTER — Encounter: Payer: Self-pay | Admitting: Internal Medicine

## 2019-03-27 VITALS — BP 150/80 | HR 64 | Temp 97.5°F | Ht 65.0 in | Wt 220.0 lb

## 2019-03-27 DIAGNOSIS — I5032 Chronic diastolic (congestive) heart failure: Secondary | ICD-10-CM | POA: Diagnosis not present

## 2019-03-27 DIAGNOSIS — R0602 Shortness of breath: Secondary | ICD-10-CM | POA: Diagnosis not present

## 2019-03-27 NOTE — Patient Instructions (Signed)
Medication Instructions:  Your physician recommends that you continue on your current medications as directed. Please refer to the Current Medication list given to you today.  If you need a refill on your cardiac medications before your next appointment, please call your pharmacy.   Lab work: Your physician recommends that you return for lab work in: Today   If you have labs (blood work) drawn today and your tests are completely normal, you will receive your results only by: Marland Kitchen MyChart Message (if you have MyChart) OR . A paper copy in the mail If you have any lab test that is abnormal or we need to change your treatment, we will call you to review the results.  Testing/Procedures: Your physician has requested that you have an echocardiogram. Echocardiography is a painless test that uses sound waves to create images of your heart. It provides your doctor with information about the size and shape of your heart and how well your heart's chambers and valves are working. This procedure takes approximately one hour. There are no restrictions for this procedure.  Your physician has requested that you have a lexiscan myoview. For further information please visit [email or web address]. Please follow instruction sheet, as given.   Follow-Up: At [facility], you and your health needs are our priority.  As part of our continuing mission to provide you with exceptional heart care, we have created designated Provider Care Teams.  These Care Teams include your primary Cardiologist (physician) and Advanced Practice Providers (APPs -  Physician Assistants and Nurse Practitioners) who all work together to provide you with the care you need, when you need it. You will need a follow up appointment Pending your test results. .  Please call our office 2 months in advance to schedule this appointment.  You may see Dorris Carnes, MD or one of the following Advanced Practice Providers on your designated Care Team:    Bernerd Pho, PA-C [facility]) . Ermalinda Barrios, PA-C ([place])  Any Other Special Instructions Will Be Listed Below (If Applicable). Thank you for choosing [facility]!

## 2019-04-03 ENCOUNTER — Other Ambulatory Visit: Payer: Self-pay

## 2019-04-03 ENCOUNTER — Encounter (HOSPITAL_COMMUNITY): Payer: Medicare HMO

## 2019-04-03 ENCOUNTER — Ambulatory Visit (HOSPITAL_COMMUNITY)
Admission: RE | Admit: 2019-04-03 | Discharge: 2019-04-03 | Disposition: A | Discharge status: Home / Self Care | Payer: Medicare HMO | Source: Ambulatory Visit | Attending: Internal Medicine | Admitting: Internal Medicine

## 2019-04-03 ENCOUNTER — Encounter (HOSPITAL_COMMUNITY): Admission: RE | Admit: 2019-04-03 | Payer: Medicare HMO | Source: Ambulatory Visit

## 2019-04-03 DIAGNOSIS — I5032 Chronic diastolic (congestive) heart failure: Secondary | ICD-10-CM | POA: Diagnosis present

## 2019-04-03 DIAGNOSIS — R0602 Shortness of breath: Secondary | ICD-10-CM

## 2019-04-03 NOTE — Progress Notes (Signed)
  Echocardiogram 2D Echocardiogram has been performed.  Ann Boone 04/03/2019, 9:16 AM

## 2019-09-07 NOTE — Progress Notes (Signed)
Cardiology Office Note   Date:  09/08/2019   ID:  Derricka Mertz, DOB 1940-09-01, MRN [id number]  PCP:  Altamease Oiler, FNP  Cardiologist:   Dietrich Pates, MD   Pt presents  f/u of diastolic CHF      History of Present Illness: Ann Boone is a 79 y.o. female with a history of edema, diastoolic CHF. I saw her in Aug 2020 Since seen she denies CP   She does say sshe gets SOB walking   Denies significant edema  Says she is watching salt Chronic 3 pillow orthopnea  No change    Current Meds  Medication Sig  . allopurinol (ZYLOPRIM) 300 MG tablet Take 300 mg by mouth daily.  . benazepril (LOTENSIN) 20 MG tablet Take 20 mg by mouth daily.  . carvedilol (COREG) 12.5 MG tablet Take 12.5 mg by mouth 2 (two) times daily with a meal.  . cetirizine (ZYRTEC) 10 MG tablet Take 10 mg by mouth at bedtime.  . colchicine 0.6 MG tablet Take 0.6 mg by mouth 2 (two) times daily as needed. For gout flare-ups  . ferrous sulfate 325 (65 FE) MG tablet Take 325 mg by mouth every evening.   . fluticasone (FLONASE) 50 MCG/ACT nasal spray Place 1-2 sprays into both nostrils daily as needed for rhinitis.  . Fluticasone-Salmeterol (ADVAIR) 250-50 MCG/DOSE AEPB Inhale 1 puff into the lungs 2 (two) times daily.  . furosemide (LASIX) 40 MG tablet TAKE 1/2 TABLET BY MOUTH EVERYDAY EXCEPT MONDAY AND THURSDAY. TAKE ONE TABLET BY MOUTH ON THOSE DAYS  . gabapentin (NEURONTIN) 300 MG capsule Take 300 mg by mouth 3 (three) times daily.  Marland Kitchen glipiZIDE (GLUCOTROL) 5 MG tablet Take 5 mg by mouth 2 (two) times daily.  Marland Kitchen ipratropium (ATROVENT HFA) 17 MCG/ACT inhaler Inhale 2 puffs into the lungs 2 (two) times daily.  Marland Kitchen lovastatin (MEVACOR) 40 MG tablet Take 40 mg by mouth at bedtime.  . Melatonin 3 MG TABS Take 3 mg by mouth at bedtime as needed. For sleep  . montelukast (SINGULAIR) 10 MG tablet Take 10 mg by mouth at bedtime.  Marland Kitchen omeprazole (PRILOSEC) 20 MG capsule Take 20 mg by mouth daily.  . traMADol (ULTRAM) 50 MG  tablet Take 50 mg by mouth every 6 (six) hours as needed for moderate pain.  . Vitamin D, Ergocalciferol, (DRISDOL) 50000 units CAPS capsule Take 50,000 Units by mouth every 30 (thirty) days.     Allergies:   Clindamycin/lincomycin   Past Medical History:  Diagnosis Date  . Asthma   . CHF (congestive heart failure) ([facility])   . COPD (chronic obstructive pulmonary disease) ([facility]) 05/18/2015  . Diabetes mellitus without complication ([facility])   . Hypertension   . Mixed hyperlipidemia     Past Surgical History:  Procedure Laterality Date  . CATARACT EXTRACTION    . episiotomy repair       Social History:  The patient  reports that she quit smoking about 36 years ago. Her smoking use included cigarettes. She has never used smokeless tobacco. She reports that she does not drink alcohol or use drugs.   Family History:  The patient's family history includes Alcoholism in her brother; Cancer in her sister; Diabetes in her sister; Glaucoma in her brother and father; Heart disease in her father, maternal grandfather, and sister; Hypertension in her brother and mother; Kidney disease in her mother.    ROS:  Please see the history of present illness. All other systems are reviewed and  Negative to the above problem except as noted.    PHYSICAL EXAM: VS:  BP (!) 157/68   Pulse 60   Temp 98.2 F (36.8 C) (Temporal)   Ht 5\' 5"  (1.651 m)   Wt 219 lb (99.3 kg)   SpO2 97%   BMI 36.44 kg/m   GEN: Morbidly obese 79 yo, in no acute distress  HEENT: normal  Neck: JVP is not elevated    Cardiac: RRR; no murmurs, rubs, or gallops,   Tr to 1+ edema (support socks on) Respiratory:  clear to auscultation bilaterally, normal work of breathing GI: soft, nontender, nondistended, + BS  No hepatomegaly  MS: no deformity Moving all extremities   Skin: warm and dry, no rash Neuro:  Grossly intact   Psych: euthymic mood, full affect   EKG:  EKG is not   done today     Lipid Panel    Component Value  Date/Time   CHOL 134 08/02/2018 1446   TRIG 53 08/02/2018 1446   HDL 47 08/02/2018 1446   CHOLHDL 2.9 08/02/2018 1446   VLDL 11 08/02/2018 1446   LDLCALC 76 08/02/2018 1446      Wt Readings from Last 3 Encounters:  09/08/19 219 lb (99.3 kg)  03/27/19 220 lb (99.8 kg)  08/02/18 221 lb (100.2 kg)      ASSESSMENT AND PLAN: 1 Dypsnea.   ? Related to BP  ?Question fluid  Will check labs today  BNP BMT, TSH, CBC  2  Chronic diastlic CHF  Volume may be up some   Not bad   Will check labs   3  HTN  BP is again increased   Will add aldactone 25   Check BMET in 10 day   Pt denies symptoms of sleep apnea   3    HL   Continue lovastatin  Check lpids     4  CP  Denies  Pt never had myovue   I am not convinced dyspnea is anginal equivalent  Plan f/u in 1 month     Current medicines are reviewed at length with the patient today.  The patient does not have concerns regarding medicines.  Signed, Dorris Carnes, MD  09/08/2019 1:01 PM    [facility] [place], [place], [place]  [address] Phone: [phone]; Fax: [phone]

## 2019-09-08 ENCOUNTER — Encounter: Payer: Self-pay | Admitting: Internal Medicine

## 2019-09-08 ENCOUNTER — Other Ambulatory Visit: Payer: Self-pay

## 2019-09-08 ENCOUNTER — Ambulatory Visit (INDEPENDENT_AMBULATORY_CARE_PROVIDER_SITE_OTHER): Payer: Medicare Other | Admitting: Internal Medicine

## 2019-09-08 VITALS — BP 157/68 | HR 60 | Temp 98.2°F | Ht 65.0 in | Wt 219.0 lb

## 2019-09-08 DIAGNOSIS — N1831 Chronic kidney disease, stage 3a: Secondary | ICD-10-CM | POA: Diagnosis not present

## 2019-09-08 DIAGNOSIS — I5032 Chronic diastolic (congestive) heart failure: Secondary | ICD-10-CM | POA: Diagnosis not present

## 2019-09-08 MED ORDER — SPIRONOLACTONE 25 MG PO TABS: 25.0000 mg | ORAL_TABLET | Freq: Every day | ORAL | 3 refills | Status: DC

## 2019-09-08 NOTE — Patient Instructions (Signed)
Medication Instructions:  START Aldactone 25 mg daily   *If you need a refill on your cardiac medications before your next appointment, please call your pharmacy*  Lab Work: Get lab work in 10 days (09/17/2019)  If you have labs (blood work) drawn today and your tests are completely normal, you will receive your results only by: Marland Kitchen MyChart Message (if you have MyChart) OR . A paper copy in the mail If you have any lab test that is abnormal or we need to change your treatment, we will call you to review the results.  Testing/Procedures: None  Follow-Up: At [facility], you and your health needs are our priority.  As part of our continuing mission to provide you with exceptional heart care, we have created designated Provider Care Teams.  These Care Teams include your primary Cardiologist (physician) and Advanced Practice Providers (APPs -  Physician Assistants and Nurse Practitioners) who all work together to provide you with the care you need, when you need it.  Your next appointment:   4 week(s)  The format for your next appointment:   In Person  Provider:   You may see Dietrich Pates, MD or one of the following Advanced Practice Providers on your designated Care Team:      Jacolyn Reedy, PA-C    Other Instructions None     Thank you for choosing [facility] !

## 2019-09-29 ENCOUNTER — Other Ambulatory Visit: Payer: Self-pay | Admitting: Internal Medicine

## 2019-09-30 LAB — BASIC METABOLIC PANEL
BUN/Creatinine Ratio: 17 (calc) (ref 6–22)
BUN: 20 mg/dL (ref 7–25)
CO2: 28 mmol/L (ref 20–32)
Calcium: 9.9 mg/dL (ref 8.6–10.4)
Chloride: 102 mmol/L (ref 98–110)
Creat: 1.15 mg/dL — ABNORMAL HIGH (ref 0.60–0.93)
Glucose, Bld: 118 mg/dL — ABNORMAL HIGH (ref 65–99)
Potassium: 4.8 mmol/L (ref 3.5–5.3)
Sodium: 141 mmol/L (ref 135–146)

## 2019-09-30 LAB — LIPID PANEL
Cholesterol: 123 mg/dL (ref <200)
HDL: 46 mg/dL — ABNORMAL LOW (ref >=50)
LDL Cholesterol (Calc): 63 mg/dL (calc)
Non-HDL Cholesterol (Calc): 77 mg/dL (calc) (ref <130)
Total CHOL/HDL Ratio: 2.7 (calc) (ref <5.0)
Triglycerides: 59 mg/dL (ref <150)

## 2019-09-30 LAB — TSH: TSH: 3.56 mIU/L (ref 0.40–4.50)

## 2019-09-30 LAB — BRAIN NATRIURETIC PEPTIDE: Brain Natriuretic Peptide: 11 pg/mL (ref <100)

## 2019-10-01 ENCOUNTER — Telehealth: Payer: Self-pay

## 2019-10-01 NOTE — Telephone Encounter (Signed)
-----   Message from Pricilla Riffle, MD sent at 09/30/2019  5:36 PM EST ----- Thyroid function is normal Lipids are excellent  Kidney function is stable FLuid function is normal

## 2019-10-01 NOTE — Telephone Encounter (Signed)
The patient has been notified of the result and verbalized understanding.  All questions (if any) were answered. Leanord Hawking, RN 10/01/2019 9:03 AM

## 2019-10-07 ENCOUNTER — Ambulatory Visit: Payer: Medicare Other | Admitting: Student

## 2019-10-07 NOTE — Progress Notes (Deleted)
Cardiology Office Note    Date:  10/07/2019   ID:  Ann Boone, DOB 1941-02-12, MRN [id number]  PCP:  Altamease Oiler, FNP  Cardiologist: Dietrich Pates, MD    No chief complaint on file.   History of Present Illness:    Ann Boone is a 79 y.o. female with past medical history of chronic diastolic CHF, HTN, Type 2 DM and COPD who presents to the office today for 108-month follow-up.  She was last examined by Dr. Tenny Craw on 09/08/2019 and reported dyspnea on exertion and 3-pillow orthopnea. BP was elevated and she was started on Aldactone 25mg  daily. BNP and TSH were WNL. Renal function remained stable following medication adjustments with creatinine at 1.15.    Past Medical History:  Diagnosis Date  . Asthma   . CHF (congestive heart failure) ([facility])   . COPD (chronic obstructive pulmonary disease) ([facility]) 05/18/2015  . Diabetes mellitus without complication ([facility])   . Hypertension   . Mixed hyperlipidemia     Past Surgical History:  Procedure Laterality Date  . CATARACT EXTRACTION    . episiotomy repair      Current Medications: Outpatient Medications Prior to Visit  Medication Sig Dispense Refill  . allopurinol (ZYLOPRIM) 300 MG tablet Take 300 mg by mouth daily.    . benazepril (LOTENSIN) 20 MG tablet Take 20 mg by mouth daily.    . carvedilol (COREG) 12.5 MG tablet Take 12.5 mg by mouth 2 (two) times daily with a meal.    . cetirizine (ZYRTEC) 10 MG tablet Take 10 mg by mouth at bedtime.    . colchicine 0.6 MG tablet Take 0.6 mg by mouth 2 (two) times daily as needed. For gout flare-ups    . ferrous sulfate 325 (65 FE) MG tablet Take 325 mg by mouth every evening.     . fluticasone (FLONASE) 50 MCG/ACT nasal spray Place 1-2 sprays into both nostrils daily as needed for rhinitis.    . Fluticasone-Salmeterol (ADVAIR) 250-50 MCG/DOSE AEPB Inhale 1 puff into the lungs 2 (two) times daily.    . furosemide (LASIX) 40 MG tablet TAKE 1/2 TABLET BY MOUTH EVERYDAY EXCEPT  MONDAY AND THURSDAY. TAKE ONE TABLET BY MOUTH ON THOSE DAYS 90 tablet 2  . gabapentin (NEURONTIN) 300 MG capsule Take 300 mg by mouth 3 (three) times daily.    05/20/2015 glipiZIDE (GLUCOTROL) 5 MG tablet Take 5 mg by mouth 2 (two) times daily.    Marland Kitchen ipratropium (ATROVENT HFA) 17 MCG/ACT inhaler Inhale 2 puffs into the lungs 2 (two) times daily.    Marland Kitchen lovastatin (MEVACOR) 40 MG tablet Take 40 mg by mouth at bedtime.    . Melatonin 3 MG TABS Take 3 mg by mouth at bedtime as needed. For sleep    . montelukast (SINGULAIR) 10 MG tablet Take 10 mg by mouth at bedtime.    Marland Kitchen omeprazole (PRILOSEC) 20 MG capsule Take 20 mg by mouth daily.    Marland Kitchen spironolactone (ALDACTONE) 25 MG tablet Take 1 tablet (25 mg total) by mouth daily. 90 tablet 3  . traMADol (ULTRAM) 50 MG tablet Take 50 mg by mouth every 6 (six) hours as needed for moderate pain.    . Vitamin D, Ergocalciferol, (DRISDOL) 50000 units CAPS capsule Take 50,000 Units by mouth every 30 (thirty) days.     No facility-administered medications prior to visit.     Allergies:   Clindamycin/lincomycin   Social History   Socioeconomic History  . Marital status: Single  Spouse name: Not on file  . Number of children: Not on file  . Years of education: Not on file  . Highest education level: Not on file  Occupational History  . Not on file  Tobacco Use  . Smoking status: Former Smoker    Types: Cigarettes    Quit date: 08/29/1983    Years since quitting: 36.1  . Smokeless tobacco: Never Used  Substance and Sexual Activity  . Alcohol use: No  . Drug use: No  . Sexual activity: Yes    Partners: Male  Other Topics Concern  . Not on file  Social History Narrative  . Not on file   Social Determinants of Health   Financial Resource Strain:   . Difficulty of Paying Living Expenses: Not on file  Food Insecurity:   . Worried About Charity fundraiser in the Last Year: Not on file  . Ran Out of Food in the Last Year: Not on file  Transportation  Needs:   . Lack of Transportation (Medical): Not on file  . Lack of Transportation (Non-Medical): Not on file  Physical Activity:   . Days of Exercise per Week: Not on file  . Minutes of Exercise per Session: Not on file  Stress:   . Feeling of Stress : Not on file  Social Connections:   . Frequency of Communication with Friends and Family: Not on file  . Frequency of Social Gatherings with Friends and Family: Not on file  . Attends Religious Services: Not on file  . Active Member of Clubs or Organizations: Not on file  . Attends Archivist Meetings: Not on file  . Marital Status: Not on file     Family History:  The patient's ***family history includes Alcoholism in her brother; Cancer in her sister; Diabetes in her sister; Glaucoma in her brother and father; Heart disease in her father, maternal grandfather, and sister; Hypertension in her brother and mother; Kidney disease in her mother.   Review of Systems:   Please see the history of present illness.     General:  No chills, fever, night sweats or weight changes.  Cardiovascular:  No chest pain, dyspnea on exertion, edema, orthopnea, palpitations, paroxysmal nocturnal dyspnea. Dermatological: No rash, lesions/masses Respiratory: No cough, dyspnea Urologic: No hematuria, dysuria Abdominal:   No nausea, vomiting, diarrhea, bright red blood per rectum, melena, or hematemesis Neurologic:  No visual changes, wkns, changes in mental status. All other systems reviewed and are otherwise negative except as noted above.   Physical Exam:    VS:  There were no vitals taken for this visit.   General: Well developed, well nourished,female appearing in no acute distress. Head: Normocephalic, atraumatic, sclera non-icteric.  Neck: No carotid bruits. JVD not elevated.  Lungs: Respirations regular and unlabored, without wheezes or rales.  Heart: ***Regular rate and rhythm. No S3 or S4.  No murmur, no rubs, or gallops  appreciated. Abdomen: Soft, non-tender, non-distended. No obvious abdominal masses. Msk:  Strength and tone appear normal for age. No obvious joint deformities or effusions. Extremities: No clubbing or cyanosis. No edema.  Distal pedal pulses are 2+ bilaterally. Neuro: Alert and oriented X 3. Moves all extremities spontaneously. No focal deficits noted. Psych:  Responds to questions appropriately with a normal affect. Skin: No rashes or lesions noted  Wt Readings from Last 3 Encounters:  09/08/19 219 lb (99.3 kg)  03/27/19 220 lb (99.8 kg)  08/02/18 221 lb (100.2 kg)  Studies/Labs Reviewed:   EKG:  EKG is*** ordered today.  The ekg ordered today demonstrates ***  Recent Labs: 09/29/2019: Brain Natriuretic Peptide 11; BUN 20; Creat 1.15; Potassium 4.8; Sodium 141; TSH 3.56   Lipid Panel    Component Value Date/Time   CHOL 123 09/29/2019 1022   TRIG 59 09/29/2019 1022   HDL 46 (L) 09/29/2019 1022   CHOLHDL 2.7 09/29/2019 1022   VLDL 11 08/02/2018 1446   LDLCALC 63 09/29/2019 1022    Additional studies/ records that were reviewed today include:   Echocardiogram: 04/2019 IMPRESSIONS    1. The left ventricle has normal systolic function with an ejection  fraction of 60-65%. The cavity size was normal. There is mildly increased  left ventricular wall thickness. Left ventricular diastolic Doppler  parameters are consistent with impaired  relaxation.  2. The right ventricle has normal systolic function. The cavity was  normal. There is no increase in right ventricular wall thickness. Right  ventricular systolic pressure is normal with an estimated pressure of 25.9  mmHg.  3. Left atrial size was mildly dilated.  4. The aortic valve is tricuspid. Mild aortic annular calcification  noted.  5. The mitral valve is grossly normal.  6. The tricuspid valve is grossly normal.  7. The aorta is normal unless otherwise noted.   Assessment:    No diagnosis  found.   Plan:   In order of problems listed above:  1. ***    Medication Adjustments/Labs and Tests Ordered: Current medicines are reviewed at length with the patient today.  Concerns regarding medicines are outlined above.  Medication changes, Labs and Tests ordered today are listed in the Patient Instructions below. There are no Patient Instructions on file for this visit.   Signed, Ellsworth Lennox, PA-C  10/07/2019 6:27 AM    [facility] [address] [place], [place] [address] Phone: [phone] Fax: [phone]

## 2019-10-21 NOTE — Progress Notes (Signed)
Cardiology Office Note    Date:  10/27/2019   ID:  Ann Boone, DOB February 20, 1941, MRN [id number]  PCP:  Joyice Faster, FNP  Cardiologist: Dorris Carnes, MD EPS: None  Chief Complaint  Patient presents with  . Follow-up    History of Present Illness:  Ann Boone is a 79 y.o. female with history of diastolic CHF, lower extemity edema. Saw Dr. Harrington Challenger 09/08/19 and having DOE and orthopnea. BNP was normal, LDL 63, Crt 1.15. Echo 04/2019 LVEF 60-65% with mild LVH and DD. Aldactone 25 mg daily added for HTN.  Patient comes in for f/u.Still gets short of breath but she thinks it may be from allergies. No swelling today-only happens when she eats out.weight down 3 lbs. No chest pain. No regular exercise. Having a hard time with her memory and going to see her primary care for this.     Past Medical History:  Diagnosis Date  . Asthma   . CHF (congestive heart failure) ([place])   . COPD (chronic obstructive pulmonary disease) ([place]) 05/18/2015  . Diabetes mellitus without complication ([place])   . Hypertension   . Mixed hyperlipidemia     Past Surgical History:  Procedure Laterality Date  . CATARACT EXTRACTION    . episiotomy repair      Current Medications: Current Meds  Medication Sig  . allopurinol (ZYLOPRIM) 300 MG tablet Take 300 mg by mouth daily.  . benazepril (LOTENSIN) 20 MG tablet Take 20 mg by mouth daily.  . carvedilol (COREG) 12.5 MG tablet Take 12.5 mg by mouth 2 (two) times daily with a meal.  . cetirizine (ZYRTEC) 10 MG tablet Take 10 mg by mouth at bedtime.  . colchicine 0.6 MG tablet Take 0.6 mg by mouth 2 (two) times daily as needed. For gout flare-ups  . ferrous sulfate 325 (65 FE) MG tablet Take 325 mg by mouth every evening.   . fluticasone (FLONASE) 50 MCG/ACT nasal spray Place 1-2 sprays into both nostrils daily as needed for rhinitis.  . Fluticasone-Salmeterol (ADVAIR) 250-50 MCG/DOSE AEPB Inhale 1 puff into the lungs 2 (two) times daily.  . furosemide  (LASIX) 40 MG tablet TAKE 1/2 TABLET BY MOUTH EVERYDAY EXCEPT MONDAY AND THURSDAY. TAKE ONE TABLET BY MOUTH ON THOSE DAYS  . gabapentin (NEURONTIN) 300 MG capsule Take 300 mg by mouth 3 (three) times daily.  Marland Kitchen glipiZIDE (GLUCOTROL) 5 MG tablet Take 5 mg by mouth 2 (two) times daily.  Marland Kitchen ipratropium (ATROVENT HFA) 17 MCG/ACT inhaler Inhale 2 puffs into the lungs 2 (two) times daily.  Marland Kitchen lovastatin (MEVACOR) 40 MG tablet Take 40 mg by mouth at bedtime.  . Melatonin 3 MG TABS Take 3 mg by mouth at bedtime as needed. For sleep  . montelukast (SINGULAIR) 10 MG tablet Take 10 mg by mouth at bedtime.  Marland Kitchen omeprazole (PRILOSEC) 20 MG capsule Take 20 mg by mouth daily.  Marland Kitchen spironolactone (ALDACTONE) 25 MG tablet Take 1 tablet (25 mg total) by mouth daily.  . traMADol (ULTRAM) 50 MG tablet Take 50 mg by mouth every 6 (six) hours as needed for moderate pain.  . Vitamin D, Ergocalciferol, (DRISDOL) 50000 units CAPS capsule Take 50,000 Units by mouth every 30 (thirty) days.     Allergies:   Clindamycin/lincomycin   Social History   Socioeconomic History  . Marital status: Single    Spouse name: Not on file  . Number of children: Not on file  . Years of education: Not on file  . Highest  education level: Not on file  Occupational History  . Not on file  Tobacco Use  . Smoking status: Former Smoker    Types: Cigarettes    Quit date: 08/29/1983    Years since quitting: 36.1  . Smokeless tobacco: Never Used  Substance and Sexual Activity  . Alcohol use: No  . Drug use: No  . Sexual activity: Yes    Partners: Male  Other Topics Concern  . Not on file  Social History Narrative  . Not on file   Social Determinants of Health   Financial Resource Strain:   . Difficulty of Paying Living Expenses:   Food Insecurity:   . Worried About Charity fundraiser in the Last Year:   . Arboriculturist in the Last Year:   Transportation Needs:   . Film/video editor (Medical):   Marland Kitchen Lack of Transportation  (Non-Medical):   Physical Activity:   . Days of Exercise per Week:   . Minutes of Exercise per Session:   Stress:   . Feeling of Stress :   Social Connections:   . Frequency of Communication with Friends and Family:   . Frequency of Social Gatherings with Friends and Family:   . Attends Religious Services:   . Active Member of Clubs or Organizations:   . Attends Archivist Meetings:   Marland Kitchen Marital Status:      Family History:  The patient's family history includes Alcoholism in her brother; Cancer in her sister; Diabetes in her sister; Glaucoma in her brother and father; Heart disease in her father, maternal grandfather, and sister; Hypertension in her brother and mother; Kidney disease in her mother.   ROS:   Please see the history of present illness.    ROS All other systems reviewed and are negative.   PHYSICAL EXAM:   VS:  BP 128/74   Pulse 69   Temp 97.8 F (36.6 C)   Ht '5\' 5"'  (1.651 m)   Wt 216 lb (98 kg)   SpO2 92%   BMI 35.94 kg/m   Physical Exam  GEN: Obese, in no acute distress  Neck: no JVD, carotid bruits, or masses Cardiac:RRR; no murmurs, rubs, or gallops  Respiratory:  clear to auscultation bilaterally, normal work of breathing GI: soft, nontender, nondistended, + BS Ext: without cyanosis, clubbing, or edema, Good distal pulses bilaterally Neuro:  Alert and Oriented x 3 Psych: euthymic mood, full affect  Wt Readings from Last 3 Encounters:  10/27/19 216 lb (98 kg)  09/08/19 219 lb (99.3 kg)  03/27/19 220 lb (99.8 kg)      Studies/Labs Reviewed:   EKG:  EKG is not ordered today.    Recent Labs: 09/29/2019: Brain Natriuretic Peptide 11; BUN 20; Creat 1.15; Potassium 4.8; Sodium 141; TSH 3.56   Lipid Panel    Component Value Date/Time   CHOL 123 09/29/2019 1022   TRIG 59 09/29/2019 1022   HDL 46 (L) 09/29/2019 1022   CHOLHDL 2.7 09/29/2019 1022   VLDL 11 08/02/2018 1446   LDLCALC 63 09/29/2019 1022    Additional studies/ records that  were reviewed today include:  Echo 04/29/20 IMPRESSIONS     1. The left ventricle has normal systolic function with an ejection  fraction of 60-65%. The cavity size was normal. There is mildly increased  left ventricular wall thickness. Left ventricular diastolic Doppler  parameters are consistent with impaired  relaxation.   2. The right ventricle has normal systolic function. The  cavity was  normal. There is no increase in right ventricular wall thickness. Right  ventricular systolic pressure is normal with an estimated pressure of 25.9  mmHg.   3. Left atrial size was mildly dilated.   4. The aortic valve is tricuspid. Mild aortic annular calcification  noted.   5. The mitral valve is grossly normal.   6. The tricuspid valve is grossly normal.   7. The aorta is normal unless otherwise noted.   FINDINGS   Left Ventricle: The left ventricle has normal systolic function, with an  ejection fraction of 60-65%. The cavity size was normal. There is mildly  increased left ventricular wall thickness. Left ventricular diastolic  Doppler parameters are consistent  with impaired relaxation.   Right Ventricle: The right ventricle has normal systolic function. The  cavity was normal. There is no increase in right ventricular wall  thickness. Right ventricular systolic pressure is normal with an estimated  pressure of 25.9 mmHg.   Left Atrium: Left atrial size was mildly dilated.   Right Atrium: Right atrial size was normal in size. Right atrial pressure  is estimated at 3 mmHg.   Interatrial Septum: No atrial level shunt detected by color flow Doppler.   Pericardium: There is no evidence of pericardial effusion. There is a  pericardial fat pad noted.   Mitral Valve: The mitral valve is grossly normal. Mitral valve  regurgitation is trivial by color flow Doppler.   Tricuspid Valve: The tricuspid valve is grossly normal. Tricuspid valve  regurgitation is trivial by color flow  Doppler.   Aortic Valve: The aortic valve is tricuspid Aortic valve regurgitation was  not visualized by color flow Doppler. Mild aortic annular calcification  noted.   Pulmonic Valve: The pulmonic valve was grossly normal. Pulmonic valve  regurgitation is trivial by color flow Doppler.   Aorta: The aorta is normal unless otherwise noted.   Venous: The inferior vena cava is normal in size with greater than 50%  respiratory variability.         ASSESSMENT:    1. Chronic diastolic CHF (congestive heart failure) ([place])   2. Essential hypertension   3. Hyperlipidemia, unspecified hyperlipidemia type   4. Stage 3a chronic kidney disease   5. Memory loss      PLAN:  In order of problems listed above:  Chronic diastolic CHF LOV with increased edema and DOE but BNP normal and echo with normal LVEF 60-65% on echo 04/2019-started on spironolactone 25 mg once daily.  Weight down 3 pounds.  Still has some shortness of breath but suspect it is due to deconditioning and she also is struggling with allergies.  Will check be met today to follow-up renal and potassium on spironolactone.  HTN blood pressure well controlled since addition of spironolactone.  We will follow-up labs.  HLD on lovastatin-LDL 63 on 09/29/2019.  CKD stage III creatinine 1.15 last office visit.  Will repeat today on spironolactone  Memory loss-to make appt with PCP to discuss    Medication Adjustments/Labs and Tests Ordered: Current medicines are reviewed at length with the patient today.  Concerns regarding medicines are outlined above.  Medication changes, Labs and Tests ordered today are listed in the Patient Instructions below. Patient Instructions  Medication Instructions:  Your physician recommends that you continue on your current medications as directed. Please refer to the Current Medication list given to you today.  *If you need a refill on your cardiac medications before your next appointment, please  call your  pharmacy*   Lab Work: Your physician recommends that you return for lab work in: Today   If you have labs (blood work) drawn today and your tests are completely normal, you will receive your results only by: Marland Kitchen MyChart Message (if you have MyChart) OR . A paper copy in the mail If you have any lab test that is abnormal or we need to change your treatment, we will call you to review the results.   Testing/Procedures: NONE    Follow-Up: At [facility] you and your health needs are our priority.  As part of our continuing mission to provide you with exceptional heart care, we have created designated Provider Care Teams.  These Care Teams include your primary Cardiologist (physician) and Advanced Practice Providers (APPs -  Physician Assistants and Nurse Practitioners) who all work together to provide you with the care you need, when you need it.  We recommend signing up for the patient portal called "MyChart".  Sign up information is provided on this After Visit Summary.  MyChart is used to connect with patients for Virtual Visits (Telemedicine).  Patients are able to view lab/test results, encounter notes, upcoming appointments, etc.  Non-urgent messages can be sent to your provider as well.   To learn more about what you can do with MyChart, go to [email or web address].    Your next appointment:   4-5 month(s)  The format for your next appointment:   In Person  Provider:   Dorris Carnes, MD   Other Instructions Thank you for choosing [facility]!       Sumner Boast, PA-C  10/27/2019 11:43 AM    [facility] [place], [place], [place]  [address] Phone: [phone]; Fax: [phone]

## 2019-10-27 ENCOUNTER — Encounter: Payer: Self-pay | Admitting: Physician Assistant

## 2019-10-27 ENCOUNTER — Ambulatory Visit (INDEPENDENT_AMBULATORY_CARE_PROVIDER_SITE_OTHER): Payer: Medicare Other | Admitting: Physician Assistant

## 2019-10-27 ENCOUNTER — Other Ambulatory Visit (HOSPITAL_COMMUNITY)
Admission: RE | Admit: 2019-10-27 | Discharge: 2019-10-27 | Disposition: A | Discharge status: Home / Self Care | Payer: Medicare Other | Source: Ambulatory Visit | Attending: Physician Assistant | Admitting: Physician Assistant

## 2019-10-27 ENCOUNTER — Other Ambulatory Visit: Payer: Self-pay

## 2019-10-27 VITALS — BP 128/74 | HR 69 | Temp 97.8°F | Ht 65.0 in | Wt 216.0 lb

## 2019-10-27 DIAGNOSIS — E785 Hyperlipidemia, unspecified: Secondary | ICD-10-CM | POA: Diagnosis not present

## 2019-10-27 DIAGNOSIS — N1831 Chronic kidney disease, stage 3a: Secondary | ICD-10-CM

## 2019-10-27 DIAGNOSIS — I10 Essential (primary) hypertension: Secondary | ICD-10-CM | POA: Insufficient documentation

## 2019-10-27 DIAGNOSIS — I5032 Chronic diastolic (congestive) heart failure: Secondary | ICD-10-CM

## 2019-10-27 DIAGNOSIS — R413 Other amnesia: Secondary | ICD-10-CM

## 2019-10-27 LAB — BASIC METABOLIC PANEL
Anion gap: 12 (ref 5–15)
BUN: 30 mg/dL — ABNORMAL HIGH (ref 8–23)
CO2: 27 mmol/L (ref 22–32)
Calcium: 9.7 mg/dL (ref 8.9–10.3)
Chloride: 102 mmol/L (ref 98–111)
Creatinine, Ser: 1.2 mg/dL — ABNORMAL HIGH (ref 0.44–1.00)
GFR calc Af Amer: 50 mL/min — ABNORMAL LOW (ref >60)
GFR calc non Af Amer: 44 mL/min — ABNORMAL LOW (ref >60)
Glucose, Bld: 124 mg/dL — ABNORMAL HIGH (ref 70–99)
Potassium: 4.7 mmol/L (ref 3.5–5.1)
Sodium: 141 mmol/L (ref 135–145)

## 2019-10-27 NOTE — Patient Instructions (Signed)
Medication Instructions:  Your physician recommends that you continue on your current medications as directed. Please refer to the Current Medication list given to you today.  *If you need a refill on your cardiac medications before your next appointment, please call your pharmacy*   Lab Work: Your physician recommends that you return for lab work in: Today   If you have labs (blood work) drawn today and your tests are completely normal, you will receive your results only by: Marland Kitchen MyChart Message (if you have MyChart) OR . A paper copy in the mail If you have any lab test that is abnormal or we need to change your treatment, we will call you to review the results.   Testing/Procedures: NONE    Follow-Up: At [facility], you and your health needs are our priority.  As part of our continuing mission to provide you with exceptional heart care, we have created designated Provider Care Teams.  These Care Teams include your primary Cardiologist (physician) and Advanced Practice Providers (APPs -  Physician Assistants and Nurse Practitioners) who all work together to provide you with the care you need, when you need it.  We recommend signing up for the patient portal called "MyChart".  Sign up information is provided on this After Visit Summary.  MyChart is used to connect with patients for Virtual Visits (Telemedicine).  Patients are able to view lab/test results, encounter notes, upcoming appointments, etc.  Non-urgent messages can be sent to your provider as well.   To learn more about what you can do with MyChart, go to [email or web address].    Your next appointment:   4-5 month(s)  The format for your next appointment:   In Person  Provider:   Dietrich Pates, MD   Other Instructions Thank you for choosing [facility]!

## 2019-10-30 ENCOUNTER — Telehealth: Payer: Self-pay | Admitting: *Deleted

## 2019-10-30 NOTE — Telephone Encounter (Signed)
-----   Message from Dyann Kief, PA-C sent at 10/27/2019 12:33 PM EDT ----- Kidney function up a little. Continue same treatment and repeat bmet in 2 weeks. thanks

## 2019-10-30 NOTE — Telephone Encounter (Signed)
Called patient with test results. No answer. Unable to leave msg.  

## 2019-11-04 ENCOUNTER — Telehealth: Payer: Self-pay | Admitting: *Deleted

## 2019-11-04 DIAGNOSIS — N1831 Chronic kidney disease, stage 3a: Secondary | ICD-10-CM

## 2019-11-04 NOTE — Telephone Encounter (Signed)
-----   Message from Michele M Lenze, PA-C sent at 10/27/2019 12:33 PM EDT ----- Kidney function up a little. Continue same treatment and repeat bmet in 2 weeks. thanks 

## 2019-12-22 ENCOUNTER — Other Ambulatory Visit: Payer: Self-pay | Admitting: Internal Medicine

## 2020-02-13 ENCOUNTER — Ambulatory Visit (INDEPENDENT_AMBULATORY_CARE_PROVIDER_SITE_OTHER): Payer: Medicare Other | Admitting: Internal Medicine

## 2020-02-13 ENCOUNTER — Other Ambulatory Visit: Payer: Self-pay

## 2020-02-13 ENCOUNTER — Encounter: Payer: Self-pay | Admitting: Internal Medicine

## 2020-02-13 ENCOUNTER — Other Ambulatory Visit (HOSPITAL_COMMUNITY)
Admission: RE | Admit: 2020-02-13 | Discharge: 2020-02-13 | Disposition: A | Discharge status: Home / Self Care | Payer: Medicare Other | Source: Ambulatory Visit | Attending: Internal Medicine | Admitting: Internal Medicine

## 2020-02-13 VITALS — BP 148/74 | HR 63 | Ht 65.0 in | Wt 214.0 lb

## 2020-02-13 DIAGNOSIS — I10 Essential (primary) hypertension: Secondary | ICD-10-CM | POA: Diagnosis not present

## 2020-02-13 DIAGNOSIS — E785 Hyperlipidemia, unspecified: Secondary | ICD-10-CM

## 2020-02-13 DIAGNOSIS — I5032 Chronic diastolic (congestive) heart failure: Secondary | ICD-10-CM | POA: Diagnosis not present

## 2020-02-13 DIAGNOSIS — Z79899 Other long term (current) drug therapy: Secondary | ICD-10-CM

## 2020-02-13 LAB — BASIC METABOLIC PANEL
Anion gap: 10 (ref 5–15)
BUN: 27 mg/dL — ABNORMAL HIGH (ref 8–23)
CO2: 26 mmol/L (ref 22–32)
Calcium: 10.1 mg/dL (ref 8.9–10.3)
Chloride: 102 mmol/L (ref 98–111)
Creatinine, Ser: 1.34 mg/dL — ABNORMAL HIGH (ref 0.44–1.00)
GFR calc Af Amer: 44 mL/min — ABNORMAL LOW (ref >60)
GFR calc non Af Amer: 38 mL/min — ABNORMAL LOW (ref >60)
Glucose, Bld: 86 mg/dL (ref 70–99)
Potassium: 5.5 mmol/L — ABNORMAL HIGH (ref 3.5–5.1)
Sodium: 138 mmol/L (ref 135–145)

## 2020-02-13 LAB — CBC
HCT: 35.1 % — ABNORMAL LOW (ref 36.0–46.0)
Hemoglobin: 10.6 g/dL — ABNORMAL LOW (ref 12.0–15.0)
MCH: 25.4 pg — ABNORMAL LOW (ref 26.0–34.0)
MCHC: 30.2 g/dL (ref 30.0–36.0)
MCV: 84.2 fL (ref 80.0–100.0)
Platelets: 198 10*3/uL (ref 150–400)
RBC: 4.17 MIL/uL (ref 3.87–5.11)
RDW: 14.5 % (ref 11.5–15.5)
WBC: 5.3 10*3/uL (ref 4.0–10.5)
nRBC: 0 % (ref 0.0–0.2)

## 2020-02-13 LAB — FOLATE: Folate: 15.2 ng/mL (ref >5.9)

## 2020-02-13 LAB — BRAIN NATRIURETIC PEPTIDE: B Natriuretic Peptide: 16 pg/mL (ref 0.0–100.0)

## 2020-02-13 LAB — TSH: TSH: 3.149 u[IU]/mL (ref 0.350–4.500)

## 2020-02-13 LAB — IRON AND TIBC
Iron: 74 ug/dL (ref 28–170)
Saturation Ratios: 21 % (ref 10.4–31.8)
TIBC: 345 ug/dL (ref 250–450)
UIBC: 271 ug/dL

## 2020-02-13 LAB — FERRITIN: Ferritin: 170 ng/mL (ref 11–307)

## 2020-02-13 LAB — VITAMIN B12: Vitamin B-12: 621 pg/mL (ref 180–914)

## 2020-02-13 NOTE — Progress Notes (Signed)
Cardiology Office Note   Date:  02/13/2020   ID:  Ann Boone, DOB Jun 21, 1941, MRN [id number]  PCP:  Ann Oiler, FNP  Cardiologist:   Ann Pates, MD   Pt presents for  f/u of diastolic CHF      History of Present Illness: Ann Boone is a 79 y.o. female with a history of edema, diastoolic CHF. I saw her in Feb 2021   She was seen by Ann Boone in March 2021 She had no edema per report at that visit  She was seen in renal clinc by PA on 01/27/20  HR was 53   BP 128/   She was taken down on lasix, now she reports only taking 20mg  1x per week (was on 20 daily with 40 mg 2x per week).  She was also taken off of benazepril and placed on amlodiopine 5 mg   ALso carvedilol was decreased to 6.25 mg bid     Since that visit the pt notes increased LE edema   She also complains of fatigue  No dizziness  Does give out with activity  Denies CP      Current Meds  Medication Sig   allopurinol (ZYLOPRIM) 300 MG tablet Take 300 mg by mouth daily.   amLODipine (NORVASC) 5 MG tablet Take 5 mg by mouth daily.   carvedilol (COREG) 12.5 MG tablet Take 6.25 mg by mouth 2 (two) times daily with a meal.    cetirizine (ZYRTEC) 10 MG tablet Take 10 mg by mouth at bedtime.   colchicine 0.6 MG tablet Take 0.6 mg by mouth 2 (two) times daily as needed. For gout flare-ups   ferrous sulfate 325 (65 FE) MG tablet Take 325 mg by mouth every evening.    fluticasone (FLONASE) 50 MCG/ACT nasal spray Place 1-2 sprays into both nostrils daily as needed for rhinitis.   Fluticasone-Salmeterol (ADVAIR) 250-50 MCG/DOSE AEPB Inhale 1 puff into the lungs 2 (two) times daily.   furosemide (LASIX) 20 MG tablet Take 20 mg by mouth once a week.   gabapentin (NEURONTIN) 300 MG capsule Take 300 mg by mouth 3 (three) times daily.   glipiZIDE (GLUCOTROL) 5 MG tablet Take 5 mg by mouth 2 (two) times daily.   ipratropium (ATROVENT HFA) 17 MCG/ACT inhaler Inhale 2 puffs into the lungs 2 (two) times daily.    lovastatin (MEVACOR) 40 MG tablet Take 40 mg by mouth at bedtime.   Melatonin 3 MG TABS Take 3 mg by mouth at bedtime as needed. For sleep   montelukast (SINGULAIR) 10 MG tablet Take 10 mg by mouth at bedtime.   omeprazole (PRILOSEC) 20 MG capsule Take 20 mg by mouth daily.   traMADol (ULTRAM) 50 MG tablet Take 50 mg by mouth every 6 (six) hours as needed for moderate pain.   Vitamin D, Ergocalciferol, (DRISDOL) 50000 units CAPS capsule Take 50,000 Units by mouth every 30 (thirty) days.     Allergies:   Clindamycin/lincomycin   Past Medical History:  Diagnosis Date   Asthma    CHF (congestive heart failure) ([facility])    COPD (chronic obstructive pulmonary disease) ([facility]) 05/18/2015   Diabetes mellitus without complication ([facility])    Hypertension    Mixed hyperlipidemia     Past Surgical History:  Procedure Laterality Date   CATARACT EXTRACTION     episiotomy repair       Social History:  The patient  reports that she quit smoking about 36 years ago. Her smoking use included  cigarettes. She has never used smokeless tobacco. She reports that she does not drink alcohol and does not use drugs.   Family History:  The patient's family history includes Alcoholism in her brother; Cancer in her sister; Diabetes in her sister; Glaucoma in her brother and father; Heart disease in her father, maternal grandfather, and sister; Hypertension in her brother and mother; Kidney disease in her mother.    ROS:  Please see the history of present illness. All other systems are reviewed and  Negative to the above problem except as noted.    PHYSICAL EXAM: VS:  BP (!) 148/74    Pulse 63    Ht 5\' 5"  (1.651 m)    Wt 214 lb (97.1 kg)    SpO2 99%    BMI 35.61 kg/m   GEN: Morbidly obese 79 yo, in no acute distress  HEENT: normal  Neck: JVP is not elevated    Cardiac: RRR; no murmurs, rubs, or gallops,   1+ edema (support socks on) Respiratory:  clear to auscultation bilaterally, normal work of  breathing GI: soft, nontender, nondistended, + BS  No hepatomegaly  MS: no deformity Moving all extremities   Skin: warm and dry, no rash Neuro:  Grossly intact   Psych: euthymic mood, full affect   EKG:  EKG isdone   SR 63 bpm    Lipid Panel    Component Value Date/Time   CHOL 123 09/29/2019 1022   TRIG 59 09/29/2019 1022   HDL 46 (L) 09/29/2019 1022   CHOLHDL 2.7 09/29/2019 1022   VLDL 11 08/02/2018 1446   LDLCALC 63 09/29/2019 1022      Wt Readings from Last 3 Encounters:  02/13/20 214 lb (97.1 kg)  10/27/19 216 lb (98 kg)  09/08/19 219 lb (99.3 kg)      ASSESSMENT AND PLAN: 1   Chronic diastlic CHF  Volume may be up some    May be related to change in meds (lasix decrease and amlodipine)  She does asay she is more SOB with activity   Sleeping ok  OK t rest  Recomm:  Will check BMET, BNP, TSH, CBC today    Contact pt on what to do      2  HTN   BP a little high at 148   She checks at home   Says it is better   3 Hx bradycardia  HR is better with drop in coreg    Follow   No dizziness  4 HL  Keep on statin     Plan f/u in early September      Current medicines are reviewed at length with the patient today.  The patient does not have concerns regarding medicines.  Signed, October, MD  02/13/2020 1:30 PM    [facility] [address] [place], [place], [place]  [place] Phone: [phone]; Fax: [phone]

## 2020-02-13 NOTE — Patient Instructions (Signed)
Medication Instructions:  Your physician recommends that you continue on your current medications as directed. Please refer to the Current Medication list given to you today.  *If you need a refill on your cardiac medications before your next appointment, please call your pharmacy*   Lab Work: BMET, BNP, CBC, TSH, ANEMIA PANEL TODAY  If you have labs (blood work) drawn today and your tests are completely normal, you will receive your results only by: Marland Kitchen MyChart Message (if you have MyChart) OR . A paper copy in the mail If you have any lab test that is abnormal or we need to change your treatment, we will call you to review the results.   Testing/Procedures: None   Follow-Up: At [facility], you and your health needs are our priority.  As part of our continuing mission to provide you with exceptional heart care, we have created designated Provider Care Teams.  These Care Teams include your primary Cardiologist (physician) and Advanced Practice Providers (APPs -  Physician Assistants and Nurse Practitioners) who all work together to provide you with the care you need, when you need it.  We recommend signing up for the patient portal called "MyChart".  Sign up information is provided on this After Visit Summary.  MyChart is used to connect with patients for Virtual Visits (Telemedicine).  Patients are able to view lab/test results, encounter notes, upcoming appointments, etc.  Non-urgent messages can be sent to your provider as well.   To learn more about what you can do with MyChart, go to [email or web address].    Your next appointment:   2 month(s)  The format for your next appointment:   In Person  Provider:   You may see Dietrich Pates, MD or one of the following Advanced Practice Providers on your designated Care Team:    Randall An, PA-C     Other Instructions None

## 2020-02-17 ENCOUNTER — Telehealth: Payer: Self-pay

## 2020-02-17 DIAGNOSIS — Z79899 Other long term (current) drug therapy: Secondary | ICD-10-CM

## 2020-02-17 MED ORDER — FUROSEMIDE 20 MG PO TABS: ORAL_TABLET | ORAL | 3 refills | Status: DC

## 2020-02-17 NOTE — Telephone Encounter (Signed)
-----   Message from Pricilla Riffle, MD sent at 02/16/2020  9:52 PM EDT ----- Thyroid function is normal CBC:  Hgb is mildly decreased  Stable Anemia panel OK Electrolytes: K is a little high  Watch k in foods, no salt substute Kidney function is relatively stable    Fluid overall appears normal but could try lasix every 3 days (2x per week) Repeat BMET  in 2 wks

## 2020-02-17 NOTE — Telephone Encounter (Signed)
Pt given lab results, will increase lasix yo twice a week.I mailed her lab slip

## 2020-03-02 ENCOUNTER — Other Ambulatory Visit (HOSPITAL_COMMUNITY)
Admission: RE | Admit: 2020-03-02 | Discharge: 2020-03-02 | Disposition: A | Discharge status: Home / Self Care | Payer: Medicare (Managed Care) | Source: Ambulatory Visit | Attending: Internal Medicine | Admitting: Internal Medicine

## 2020-03-02 DIAGNOSIS — Z79899 Other long term (current) drug therapy: Secondary | ICD-10-CM | POA: Insufficient documentation

## 2020-03-02 LAB — BASIC METABOLIC PANEL
Anion gap: 9 (ref 5–15)
BUN: 19 mg/dL (ref 8–23)
CO2: 27 mmol/L (ref 22–32)
Calcium: 9.5 mg/dL (ref 8.9–10.3)
Chloride: 103 mmol/L (ref 98–111)
Creatinine, Ser: 1.11 mg/dL — ABNORMAL HIGH (ref 0.44–1.00)
GFR calc Af Amer: 55 mL/min — ABNORMAL LOW (ref >60)
GFR calc non Af Amer: 48 mL/min — ABNORMAL LOW (ref >60)
Glucose, Bld: 118 mg/dL — ABNORMAL HIGH (ref 70–99)
Potassium: 4 mmol/L (ref 3.5–5.1)
Sodium: 139 mmol/L (ref 135–145)

## 2020-05-12 NOTE — Progress Notes (Signed)
Cardiology Office Note   Date:  05/14/2020   ID:  Ann Boone, DOB 1941-04-24, MRN [id number]  PCP:  Altamease Oiler, FNP  Cardiologist:   Dietrich Pates, MD   Pt presents for  f/u of diastolic CHF      History of Present Illness: Ann Boone is a 79 y.o. female with a history of edema, diastolic CHF.  I last saw the pt in July 2021    She says overall she is doing not too bad  Some swelling in legs   She does not do much  Lazy   BP at home 140s/150s   She says she has some LE edema, worse at end of day. She denies CP  Breathing is stable except when allergies revved up     Allergies:   Clindamycin/lincomycin   Past Medical History:  Diagnosis Date  . Asthma   . CHF (congestive heart failure) (HCC)   . COPD (chronic obstructive pulmonary disease) (HCC) 05/18/2015  . Diabetes mellitus without complication (HCC)   . Hypertension   . Mixed hyperlipidemia     Past Surgical History:  Procedure Laterality Date  . CATARACT EXTRACTION    . episiotomy repair       Social History:  The patient  reports that she quit smoking about 36 years ago. Her smoking use included cigarettes. She has never used smokeless tobacco. She reports that she does not drink alcohol and does not use drugs.   Family History:  The patient's family history includes Alcoholism in her brother; Cancer in her sister; Diabetes in her sister; Glaucoma in her brother and father; Heart disease in her father, maternal grandfather, and sister; Hypertension in her brother and mother; Kidney disease in her mother.    ROS:  Please see the history of present illness. All other systems are reviewed and  Negative to the above problem except as noted.    PHYSICAL EXAM: VS:  BP 140/68   Pulse 80   Ht 5\' 5"  (1.651 m)   Wt 217 lb (98.4 kg)   SpO2 97%   BMI 36.11 kg/m   GEN: Morbidly obese 79 yo, in no acute distress  HEENT: normal  Neck: JVP is not elevated    Cardiac: RRR; no murmurs 1+  LE edema  (support socks on) Respiratory:  clear to auscultation bilaterally, normal work of breathing GI: soft, nontender, nondistended, + BS  No hepatomegaly  MS: no deformity Moving all extremities   Skin: warm and dry, no rash Neuro:  Grossly intact   Psych: Rel flat affect    EKG:  EKG is not done   Lipid Panel    Component Value Date/Time   CHOL 123 09/29/2019 1022   TRIG 59 09/29/2019 1022   HDL 46 (L) 09/29/2019 1022   CHOLHDL 2.7 09/29/2019 1022   VLDL 11 08/02/2018 1446   LDLCALC 63 09/29/2019 1022      Wt Readings from Last 3 Encounters:  05/14/20 217 lb (98.4 kg)  02/13/20 214 lb (97.1 kg)  10/27/19 216 lb (98 kg)      ASSESSMENT AND PLAN: 1   Chronic diastlic CHF  Volume overal is not too bad  SOme LE edema   I told her she can take an additional Lasix if her ankles get bad   Will work on BP more with addition of Maxzide 1/2 of 37.5/12.5  Check BMET  In 10 daysCome in for BP check and symptom check in 4 wks  Call if dizzy or weak feeling  Continue to watch salt I have asked her to try to walk more as this should help    2  HTN  Changes as noted above    3 Hx bradycardia  Keep carvedilol at present dose   Had bradycardia at higher dose  4 HL  Keep on statin     Plan f/u for BP check in 4 to 6 wks   I'll see her later this winter      Current medicines are reviewed at length with the patient today.  The patient does not have concerns regarding medicines.  Signed, Dietrich Pates, MD  05/14/2020 10:04 AM    [facility] [address] [place], [place], [place]  [address] Phone: [phone]; Fax: [phone]

## 2020-05-14 ENCOUNTER — Other Ambulatory Visit: Payer: Self-pay

## 2020-05-14 ENCOUNTER — Ambulatory Visit (INDEPENDENT_AMBULATORY_CARE_PROVIDER_SITE_OTHER): Payer: Medicare HMO | Admitting: Internal Medicine

## 2020-05-14 ENCOUNTER — Encounter: Payer: Self-pay | Admitting: Internal Medicine

## 2020-05-14 VITALS — BP 140/68 | HR 80 | Ht 65.0 in | Wt 217.0 lb

## 2020-05-14 DIAGNOSIS — Z79899 Other long term (current) drug therapy: Secondary | ICD-10-CM

## 2020-05-14 MED ORDER — TRIAMTERENE-HCTZ 37.5-25 MG PO TABS: 0.5000 | ORAL_TABLET | Freq: Every day | ORAL | 3 refills | Status: DC

## 2020-05-14 MED ORDER — TRIAMTERENE-HCTZ 37.5-25 MG PO TABS
0.5000 | ORAL_TABLET | Freq: Every day | ORAL | 3 refills | Status: DC
Start: 2020-05-14 — End: 2020-05-14

## 2020-05-14 NOTE — Addendum Note (Signed)
Addended by: Kerney Elbe on: 05/14/2020 05:01 PM   Modules accepted: Orders

## 2020-05-14 NOTE — Patient Instructions (Signed)
Medication Instructions:  Your physician has recommended you make the following change in your medication:   Start Maxzide 37-25 mg Take 1/2 tablet Daily   *If you need a refill on your cardiac medications before your next appointment, please call your pharmacy*   Lab Work: Your physician recommends that you return for lab work in: 10 Days ( 05/24/20)   If you have labs (blood work) drawn today and your tests are completely normal, you will receive your results only by:  MyChart Message (if you have MyChart) OR  A paper copy in the mail If you have any lab test that is abnormal or we need to change your treatment, we will call you to review the results.   Testing/Procedures: NONE   Follow-Up: At [facility], you and your health needs are our priority.  As part of our continuing mission to provide you with exceptional heart care, we have created designated Provider Care Teams.  These Care Teams include your primary Cardiologist (physician) and Advanced Practice Providers (APPs -  Physician Assistants and Nurse Practitioners) who all work together to provide you with the care you need, when you need it.  We recommend signing up for the patient portal called "MyChart".  Sign up information is provided on this After Visit Summary.  MyChart is used to connect with patients for Virtual Visits (Telemedicine).  Patients are able to view lab/test results, encounter notes, upcoming appointments, etc.  Non-urgent messages can be sent to your provider as well.   To learn more about what you can do with MyChart, go to [email or web address].    Your next appointment:    6 weeks for Blood pressure check Follow up February or March   The format for your next appointment:   In Person  Provider:   Dietrich Pates, MD   Other Instructions Thank you for choosing [facility]!

## 2020-05-25 LAB — BASIC METABOLIC PANEL
BUN/Creatinine Ratio: 22 (calc) (ref 6–22)
BUN: 26 mg/dL — ABNORMAL HIGH (ref 7–25)
CO2: 26 mmol/L (ref 20–32)
Calcium: 10.1 mg/dL (ref 8.6–10.4)
Chloride: 97 mmol/L — ABNORMAL LOW (ref 98–110)
Creat: 1.17 mg/dL — ABNORMAL HIGH (ref 0.60–0.93)
Glucose, Bld: 181 mg/dL — ABNORMAL HIGH (ref 65–99)
Potassium: 4.6 mmol/L (ref 3.5–5.3)
Sodium: 136 mmol/L (ref 135–146)

## 2020-05-26 ENCOUNTER — Telehealth: Payer: Self-pay | Admitting: *Deleted

## 2020-05-26 NOTE — Telephone Encounter (Signed)
Lesle Chris, LPN  81/82/9937 10:44 AM EDT Back to Top    Notified, copy to pcp.

## 2020-05-26 NOTE — Telephone Encounter (Signed)
-----   Message from Pricilla Riffle, MD sent at 05/25/2020  4:43 PM EDT ----- Electrolytes and kidney function are OK Keep on same meds

## 2020-05-27 ENCOUNTER — Other Ambulatory Visit: Payer: Self-pay | Admitting: Internal Medicine

## 2020-06-28 ENCOUNTER — Ambulatory Visit (INDEPENDENT_AMBULATORY_CARE_PROVIDER_SITE_OTHER): Payer: Medicare Other | Admitting: *Deleted

## 2020-06-28 ENCOUNTER — Other Ambulatory Visit: Payer: Self-pay

## 2020-06-28 VITALS — BP 142/80 | HR 60 | Ht 65.0 in | Wt 214.0 lb

## 2020-06-28 DIAGNOSIS — Z0130 Encounter for examination of blood pressure without abnormal findings: Secondary | ICD-10-CM | POA: Diagnosis not present

## 2020-06-28 NOTE — Progress Notes (Signed)
Pt in office today for BP Check. States that she feels fine.

## 2020-06-29 ENCOUNTER — Encounter: Payer: Self-pay | Admitting: Internal Medicine

## 2020-10-11 ENCOUNTER — Ambulatory Visit (INDEPENDENT_AMBULATORY_CARE_PROVIDER_SITE_OTHER): Payer: 59 | Admitting: Internal Medicine

## 2020-10-11 ENCOUNTER — Encounter: Payer: Self-pay | Admitting: Internal Medicine

## 2020-10-11 ENCOUNTER — Other Ambulatory Visit: Payer: Self-pay

## 2020-10-11 VITALS — BP 142/78 | HR 62 | Ht 65.0 in | Wt 212.0 lb

## 2020-10-11 DIAGNOSIS — I10 Essential (primary) hypertension: Secondary | ICD-10-CM | POA: Diagnosis not present

## 2020-10-11 DIAGNOSIS — N1831 Chronic kidney disease, stage 3a: Secondary | ICD-10-CM

## 2020-10-11 DIAGNOSIS — E785 Hyperlipidemia, unspecified: Secondary | ICD-10-CM

## 2020-10-11 MED ORDER — CHLORTHALIDONE 25 MG PO TABS
12.5000 mg | ORAL_TABLET | Freq: Every day | ORAL | 3 refills | Status: DC
Start: 2020-10-11 — End: 2021-09-23

## 2020-10-11 NOTE — Progress Notes (Signed)
Cardiology Office Note   Date:  10/11/2020   ID:  Ann Boone, DOB [birth year], MRN [id number]  PCP:  Alvina Filbert, MD  Cardiologist:   Dietrich Pates, MD   Pt presents for  f/u of diastolic CHF  And HTH    History of Present Illness: Ann Boone is a 80 y.o. female with a history of edema, diastolic CHF.  I last saw the pt in July 2021    She says overall she is doing not too bad  Some swelling in legs   She does not do much  Lazy   BP at home 140s/150s    I saw her in Oct 2021   Since seen she says her breathing is OK  Denies CP     Still with some ankle swelling   Takes 20 lasix 2x per week BP at home 130s to 140s     Allergies:   Clindamycin/lincomycin   Past Medical History:  Diagnosis Date  . Asthma   . CHF (congestive heart failure) (HCC)   . COPD (chronic obstructive pulmonary disease) (HCC) 05/18/2015  . Diabetes mellitus without complication (HCC)   . Hypertension   . Mixed hyperlipidemia     Past Surgical History:  Procedure Laterality Date  . CATARACT EXTRACTION    . episiotomy repair       Social History:  The patient  reports that she quit smoking about 37 years ago. Her smoking use included cigarettes. She has never used smokeless tobacco. She reports that she does not drink alcohol and does not use drugs.   Family History:  The patient's family history includes Alcoholism in her brother; Cancer in her sister; Diabetes in her sister; Glaucoma in her brother and father; Heart disease in her father, maternal grandfather, and sister; Hypertension in her brother and mother; Kidney disease in her mother.    ROS:  Please see the history of present illness. All other systems are reviewed and  Negative to the above problem except as noted.    PHYSICAL EXAM: VS:  BP (!) 142/78   Pulse 62   Ht 5\' 5"  (1.651 m)   Wt 212 lb (96.2 kg)   SpO2 93%   BMI 35.28 kg/m   GEN: Morbidly obese 80 yo, in no acute distress  HEENT: normal  Neck: JVP is normal    Cardiac: RRR; no murmurs   Tr tp 1+  LE edema (support socks on)  Spft   Respiratory:  clear to auscultation bilaterally, normal work of breathing GI: soft, nontender, nondistended, + BS  No hepatomegaly  MS: no deformity Moving all extremities   Skin: warm and dry, no rash Neuro:  Grossly intact   Psych: Rel flat affect    EKG:  EKG is not done today   Lipid Panel    Component Value Date/Time   CHOL 123 09/29/2019 1022   TRIG 59 09/29/2019 1022   HDL 46 (L) 09/29/2019 1022   CHOLHDL 2.7 09/29/2019 1022   VLDL 11 08/02/2018 1446   LDLCALC 63 09/29/2019 1022      Wt Readings from Last 3 Encounters:  10/11/20 212 lb (96.2 kg)  06/28/20 214 lb (97.1 kg)  05/14/20 217 lb (98.4 kg)      ASSESSMENT AND PLAN: 1  BP    Last labs K ws 5.3   I would recomm switching to 12.5 chlorthalidone and stop Maxzide  Check BMET in 3 wks     Ask her to keep track  of BP and report back on what readings are at that time   Goal a little lower overall WIll set for f/u in clinic late spring   Bring cuff to that visit  2 Chronic diastlic Volume is up a little   Not too bad   Could try double (40 lasix ) 2x per week   Watch salt    Check BMET and BNP in 3 wks       3 Hx bradycardia HR is ok at current dose of carvedilol    4 HL  Keep on statin   Check lipids when she comes in for labs  F/U in cilnic at the end of May 2022    Bring cuff to that visit       Current medicines are reviewed at length with the patient today.  The patient does not have concerns regarding medicines.  Signed, Dietrich Pates, MD  10/11/2020 1:04 PM    [facility] [address] [place], [place], [place]  [address] Phone: [phone]; Fax: [phone]

## 2020-10-11 NOTE — Patient Instructions (Signed)
Medication Instructions:  Your physician has recommended you make the following change in your medication:  Stop Taking Maxzide  Start Taking Chlorthalidone 12.5 mg Daily   *If you need a refill on your cardiac medications before your next appointment, please call your pharmacy*   Lab Work: Your physician recommends that you return for lab work in: 3 Weeks at [facility] ( 11-01-2020)   If you have labs (blood work) drawn today and your tests are completely normal, you will receive your results only by: Marland Kitchen MyChart Message (if you have MyChart) OR . A paper copy in the mail If you have any lab test that is abnormal or we need to change your treatment, we will call you to review the results.   Testing/Procedures: NONE    Follow-Up: At [facility], you and your health needs are our priority.  As part of our continuing mission to provide you with exceptional heart care, we have created designated Provider Care Teams.  These Care Teams include your primary Cardiologist (physician) and Advanced Practice Providers (APPs -  Physician Assistants and Nurse Practitioners) who all work together to provide you with the care you need, when you need it.  We recommend signing up for the patient portal called "MyChart".  Sign up information is provided on this After Visit Summary.  MyChart is used to connect with patients for Virtual Visits (Telemedicine).  Patients are able to view lab/test results, encounter notes, upcoming appointments, etc.  Non-urgent messages can be sent to your provider as well.   To learn more about what you can do with MyChart, go to [email or web address].    Your next appointment:   2 month(s)  The format for your next appointment:   In Person  Provider:   Dietrich Pates, MD   Other Instructions  Your physician has requested that you regularly monitor and record your blood pressure readings at home. Please use the same machine at the same time of day to check  your readings and record them to bring to your follow-up visit.  Bring BP monitor with you to next appointment.    Thank you for choosing [facility]!

## 2020-11-01 ENCOUNTER — Other Ambulatory Visit (HOSPITAL_COMMUNITY)
Admission: RE | Admit: 2020-11-01 | Discharge: 2020-11-01 | Disposition: A | Discharge status: Home / Self Care | Payer: Medicare Other | Source: Ambulatory Visit | Attending: Internal Medicine | Admitting: Internal Medicine

## 2020-11-01 ENCOUNTER — Other Ambulatory Visit: Payer: Self-pay

## 2020-11-01 DIAGNOSIS — E785 Hyperlipidemia, unspecified: Secondary | ICD-10-CM | POA: Insufficient documentation

## 2020-11-01 DIAGNOSIS — I10 Essential (primary) hypertension: Secondary | ICD-10-CM

## 2020-11-01 DIAGNOSIS — N1831 Chronic kidney disease, stage 3a: Secondary | ICD-10-CM | POA: Diagnosis present

## 2020-11-01 DIAGNOSIS — I129 Hypertensive chronic kidney disease with stage 1 through stage 4 chronic kidney disease, or unspecified chronic kidney disease: Secondary | ICD-10-CM | POA: Diagnosis present

## 2020-11-01 LAB — BASIC METABOLIC PANEL
Anion gap: 11 (ref 5–15)
BUN: 26 mg/dL — ABNORMAL HIGH (ref 8–23)
CO2: 26 mmol/L (ref 22–32)
Calcium: 9.5 mg/dL (ref 8.9–10.3)
Chloride: 103 mmol/L (ref 98–111)
Creatinine, Ser: 1.13 mg/dL — ABNORMAL HIGH (ref 0.44–1.00)
GFR, Estimated: 50 mL/min — ABNORMAL LOW (ref >60)
Glucose, Bld: 156 mg/dL — ABNORMAL HIGH (ref 70–99)
Potassium: 4.7 mmol/L (ref 3.5–5.1)
Sodium: 140 mmol/L (ref 135–145)

## 2020-11-01 LAB — LIPID PANEL
Cholesterol: 112 mg/dL (ref 0–200)
HDL: 53 mg/dL (ref >40)
LDL Cholesterol: 52 mg/dL (ref 0–99)
Total CHOL/HDL Ratio: 2.1 RATIO
Triglycerides: 37 mg/dL (ref <150)
VLDL: 7 mg/dL (ref 0–40)

## 2020-11-01 LAB — BRAIN NATRIURETIC PEPTIDE: B Natriuretic Peptide: 20 pg/mL (ref 0.0–100.0)

## 2020-11-04 ENCOUNTER — Telehealth: Payer: Self-pay | Admitting: *Deleted

## 2020-11-04 NOTE — Telephone Encounter (Signed)
Pt notified and voiced understanding 

## 2020-11-04 NOTE — Telephone Encounter (Signed)
-----   Message from Pricilla Riffle, MD sent at 11/04/2020  9:31 AM EDT ----- Regarding: RE: BP Log Look good.  Keep on same meds, keep following.   Bring cuff to visits in future to confirm accuracy. ----- Message ----- From: Kerney Elbe, LPN Sent: 11/01/6948   8:43 AM EDT To: Pricilla Riffle, MD Subject: BP Log                                         BP readings: 134/67 HR 58, 116/65 HR 60, 135/64 HR 58, 131/65 HR 63, 139/67 HR 50, 126/58 HR 66, 134/51 HR 51, 127/51 HR 59. Please advise.

## 2021-01-03 NOTE — Progress Notes (Signed)
Cardiology Office Note   Date:  01/04/2021   ID:  [name], DOB Apr 12, 1941, MRN [id number]  PCP:  Alvina Filbert, MD  Cardiologist:   Dietrich Pates, MD   Pt presents for  f/u of diastolic CHF and HTN      History of Present Illness: Ann Boone is a 80 y.o. female with a history of edema, diastolic CHF and HTN    I last saw her in clinic back in March 2022    Since seen she says her breathing has been OK except for some wheezing earlier this spring   Used inhaler   She denies CP   No dizziness She brings in an extensive  Log  BP ranges 100s to 130s   P 50s to 60s   She also brings in her wrist cuff today   Allergies:   Clindamycin/lincomycin   Past Medical History:  Diagnosis Date  . Asthma   . CHF (congestive heart failure) (HCC)   . COPD (chronic obstructive pulmonary disease) (HCC) 05/18/2015  . Diabetes mellitus without complication (HCC)   . Hypertension   . Mixed hyperlipidemia     Past Surgical History:  Procedure Laterality Date  . CATARACT EXTRACTION    . episiotomy repair       Social History:  The patient  reports that she quit smoking about 37 years ago. Her smoking use included cigarettes. She has never used smokeless tobacco. She reports that she does not drink alcohol and does not use drugs.   Family History:  The patient's family history includes Alcoholism in her brother; Cancer in her sister; Diabetes in her sister; Glaucoma in her brother and father; Heart disease in her father, maternal grandfather, and sister; Hypertension in her brother and mother; Kidney disease in her mother.    ROS:  Please see the history of present illness. All other systems are reviewed and  Negative to the above problem except as noted.    PHYSICAL EXAM: VS:  BP 136/74   Pulse 81   Ht 5\' 5"  (1.651 m)   Wt 214 lb 12.8 oz (97.4 kg)   SpO2 95%   BMI 35.74 kg/m   GEN: Morbidly obese 80 yo, in no acute distress  HEENT: normal  Neck: JVP is normal   Cardiac:  RRR; no murmurs     LE edema (support socks on)  Spft   Respiratory:  clear to auscultation bilaterally, normal work of breathing GI: soft, nontender, nondistended, + BS  No hepatomegaly  MS: no deformity Moving all extremities   Skin: warm and dry, no rash Neuro:  Grossly intact   Psych: Rel flat affect    EKG:  EKG is not done today   Lipid Panel    Component Value Date/Time   CHOL 112 11/01/2020 0948   TRIG 37 11/01/2020 0948   HDL 53 11/01/2020 0948   CHOLHDL 2.1 11/01/2020 0948   VLDL 7 11/01/2020 0948   LDLCALC 52 11/01/2020 0948   LDLCALC 63 09/29/2019 1022      Wt Readings from Last 3 Encounters:  01/04/21 214 lb 12.8 oz (97.4 kg)  10/11/20 212 lb (96.2 kg)  06/28/20 214 lb (97.1 kg)      ASSESSMENT AND PLAN: 1  BP   BP with her cuff was 167/   Our large cuff 155/   OVrerall relatively close   At home her BP is much lower       I would follow  Keep on same meds    Recent electrolytes are OK       2 Chronic diastlic CHF   Volume status may be up a little but not bad   Continue current meds          3 Hx bradycardia HR 50s to 60s   Keep on current regimen     4 HL  Keep on statin  Lipds are excellent   LDL 52  HDL 53     F/U in cilnic   Jan /Feb   Sooner for problems        Current medicines are reviewed at length with the patient today.  The patient does not have concerns regarding medicines.  Signed, Dietrich Pates, MD  01/04/2021 3:13 PM    [facility] [address] [place], [place], [place]  [address] Phone: [phone]; Fax: [phone]      Cardiology Office Note   Date:  01/03/2021   ID:  Ann Boone, DOB 06/26/1941, MRN [id number]  PCP:  Alvina Filbert, MD  Cardiologist:   Dietrich Pates, MD   Pt presents for  f/u of diastolic CHF  And HTH    History of Present Illness: Ann Boone is a 80 y.o. female with a history of edema, diastolic CHF.  I last saw the pt in July 2021    She says overall she is doing not  too bad  Some swelling in legs   She does not do much  Lazy   BP at home 140s/150s    I saw her in Oct 2021   Since seen she says her breathing is OK  Denies CP     Still with some ankle swelling   Takes 20 lasix 2x per week BP at home 130s to 140s    I saw the pt in clinic in March 2022 Allergies:   Clindamycin/lincomycin   Past Medical History:  Diagnosis Date  . Asthma   . CHF (congestive heart failure) (HCC)   . COPD (chronic obstructive pulmonary disease) (HCC) 05/18/2015  . Diabetes mellitus without complication (HCC)   . Hypertension   . Mixed hyperlipidemia     Past Surgical History:  Procedure Laterality Date  . CATARACT EXTRACTION    . episiotomy repair       Social History:  The patient  reports that she quit smoking about 37 years ago. Her smoking use included cigarettes. She has never used smokeless tobacco. She reports that she does not drink alcohol and does not use drugs.   Family History:  The patient's family history includes Alcoholism in her brother; Cancer in her sister; Diabetes in her sister; Glaucoma in her brother and father; Heart disease in her father, maternal grandfather, and sister; Hypertension in her brother and mother; Kidney disease in her mother.    ROS:  Please see the history of present illness. All other systems are reviewed and  Negative to the above problem except as noted.    PHYSICAL EXAM: VS:  There were no vitals taken for this visit.  GEN: Morbidly obese 80 yo, in no acute distress  HEENT: normal  Neck: JVP is normal   Cardiac: RRR; no murmurs   Tr tp 1+  LE edema (support socks on)  Spft   Respiratory:  clear to auscultation bilaterally, normal work of breathing GI: soft, nontender, nondistended, + BS  No hepatomegaly  MS: no deformity Moving all extremities   Skin: warm and dry, no  rash Neuro:  Grossly intact   Psych: Rel flat affect    EKG:  EKG is not done today   Lipid Panel    Component Value Date/Time   CHOL 112  11/01/2020 0948   TRIG 37 11/01/2020 0948   HDL 53 11/01/2020 0948   CHOLHDL 2.1 11/01/2020 0948   VLDL 7 11/01/2020 0948   LDLCALC 52 11/01/2020 0948   LDLCALC 63 09/29/2019 1022      Wt Readings from Last 3 Encounters:  10/11/20 212 lb (96.2 kg)  06/28/20 214 lb (97.1 kg)  05/14/20 217 lb (98.4 kg)      ASSESSMENT AND PLAN: 1  BP    Last labs K ws 5.3   I would recomm switching to 12.5 chlorthalidone and stop Maxzide  Check BMET in 3 wks     Ask her to keep track of BP and report back on what readings are at that time   Goal a little lower overall WIll set for f/u in clinic late spring   Bring cuff to that visit  2 Chronic diastlic Volume is up a little   Not too bad   Could try double (40 lasix ) 2x per week   Watch salt    Check BMET and BNP in 3 wks       3 Hx bradycardia HR is ok at current dose of carvedilol    4 HL  Keep on statin   Check lipids when she comes in for labs  F/U in cilnic at the end of May 2022    Bring cuff to that visit       Current medicines are reviewed at length with the patient today.  The patient does not have concerns regarding medicines.  Signed, Dietrich Pates, MD  01/03/2021 9:43 PM    [facility] [address] [place], [place], [place]  [address] Phone: [phone]; Fax: [phone]

## 2021-01-04 ENCOUNTER — Encounter: Payer: Self-pay | Admitting: Internal Medicine

## 2021-01-04 ENCOUNTER — Other Ambulatory Visit: Payer: Self-pay

## 2021-01-04 ENCOUNTER — Ambulatory Visit (INDEPENDENT_AMBULATORY_CARE_PROVIDER_SITE_OTHER): Payer: Medicare Other | Admitting: Internal Medicine

## 2021-01-04 VITALS — BP 136/74 | HR 81 | Ht 65.0 in | Wt 214.8 lb

## 2021-01-04 DIAGNOSIS — I10 Essential (primary) hypertension: Secondary | ICD-10-CM | POA: Diagnosis not present

## 2021-01-04 NOTE — Patient Instructions (Signed)
Medication Instructions:  Your physician recommends that you continue on your current medications as directed. Please refer to the Current Medication list given to you today.  *If you need a refill on your cardiac medications before your next appointment, please call your pharmacy*   Lab Work: NONE   If you have labs (blood work) drawn today and your tests are completely normal, you will receive your results only by: . MyChart Message (if you have MyChart) OR . A paper copy in the mail If you have any lab test that is abnormal or we need to change your treatment, we will call you to review the results.   Testing/Procedures: NONE    Follow-Up: At [facility], you and your health needs are our priority.  As part of our continuing mission to provide you with exceptional heart care, we have created designated Provider Care Teams.  These Care Teams include your primary Cardiologist (physician) and Advanced Practice Providers (APPs -  Physician Assistants and Nurse Practitioners) who all work together to provide you with the care you need, when you need it.  We recommend signing up for the patient portal called "MyChart".  Sign up information is provided on this After Visit Summary.  MyChart is used to connect with patients for Virtual Visits (Telemedicine).  Patients are able to view lab/test results, encounter notes, upcoming appointments, etc.  Non-urgent messages can be sent to your provider as well.   To learn more about what you can do with MyChart, go to [email or web address].com.    Your next appointment:   8 month(s)  The format for your next appointment:   In Person  Provider:   Paula Ross, MD   Other Instructions Thank you for choosing [facility]!    

## 2021-08-02 ENCOUNTER — Other Ambulatory Visit: Payer: Self-pay | Admitting: Physical Medicine & Rehabilitation

## 2021-08-02 DIAGNOSIS — G8929 Other chronic pain: Secondary | ICD-10-CM

## 2021-08-12 ENCOUNTER — Ambulatory Visit
Admission: RE | Admit: 2021-08-12 | Discharge: 2021-08-12 | Disposition: A | Discharge status: Home / Self Care | Payer: 59 | Source: Ambulatory Visit | Attending: Physical Medicine & Rehabilitation | Admitting: Physical Medicine & Rehabilitation

## 2021-08-12 DIAGNOSIS — M5442 Lumbago with sciatica, left side: Secondary | ICD-10-CM | POA: Insufficient documentation

## 2021-08-12 DIAGNOSIS — M5441 Lumbago with sciatica, right side: Secondary | ICD-10-CM | POA: Diagnosis present

## 2021-08-12 DIAGNOSIS — G8929 Other chronic pain: Secondary | ICD-10-CM | POA: Diagnosis present

## 2021-09-23 ENCOUNTER — Encounter: Payer: Self-pay | Admitting: Internal Medicine

## 2021-09-23 ENCOUNTER — Other Ambulatory Visit (HOSPITAL_COMMUNITY)
Admission: RE | Admit: 2021-09-23 | Discharge: 2021-09-23 | Disposition: A | Discharge status: Home / Self Care | Payer: Medicare Other | Source: Ambulatory Visit | Attending: Internal Medicine | Admitting: Internal Medicine

## 2021-09-23 ENCOUNTER — Ambulatory Visit (INDEPENDENT_AMBULATORY_CARE_PROVIDER_SITE_OTHER): Payer: Medicare Other | Admitting: Internal Medicine

## 2021-09-23 ENCOUNTER — Other Ambulatory Visit: Payer: Self-pay

## 2021-09-23 VITALS — BP 172/58 | HR 64 | Ht 65.0 in | Wt 212.0 lb

## 2021-09-23 DIAGNOSIS — Z79899 Other long term (current) drug therapy: Secondary | ICD-10-CM

## 2021-09-23 DIAGNOSIS — I10 Essential (primary) hypertension: Secondary | ICD-10-CM | POA: Diagnosis not present

## 2021-09-23 DIAGNOSIS — I110 Hypertensive heart disease with heart failure: Secondary | ICD-10-CM | POA: Diagnosis present

## 2021-09-23 DIAGNOSIS — R609 Edema, unspecified: Secondary | ICD-10-CM | POA: Insufficient documentation

## 2021-09-23 DIAGNOSIS — E785 Hyperlipidemia, unspecified: Secondary | ICD-10-CM

## 2021-09-23 DIAGNOSIS — I5032 Chronic diastolic (congestive) heart failure: Secondary | ICD-10-CM | POA: Insufficient documentation

## 2021-09-23 LAB — CBC
HCT: 34.7 % — ABNORMAL LOW (ref 36.0–46.0)
Hemoglobin: 10.5 g/dL — ABNORMAL LOW (ref 12.0–15.0)
MCH: 25.7 pg — ABNORMAL LOW (ref 26.0–34.0)
MCHC: 30.3 g/dL (ref 30.0–36.0)
MCV: 85 fL (ref 80.0–100.0)
Platelets: 229 10*3/uL (ref 150–400)
RBC: 4.08 MIL/uL (ref 3.87–5.11)
RDW: 15 % (ref 11.5–15.5)
WBC: 5.1 10*3/uL (ref 4.0–10.5)
nRBC: 0 % (ref 0.0–0.2)

## 2021-09-23 LAB — TSH: TSH: 3.355 u[IU]/mL (ref 0.350–4.500)

## 2021-09-23 LAB — LIPID PANEL
Cholesterol: 125 mg/dL (ref 0–200)
HDL: 47 mg/dL (ref >40)
LDL Cholesterol: 63 mg/dL (ref 0–99)
Total CHOL/HDL Ratio: 2.7 RATIO
Triglycerides: 77 mg/dL (ref <150)
VLDL: 15 mg/dL (ref 0–40)

## 2021-09-23 LAB — BASIC METABOLIC PANEL
Anion gap: 7 (ref 5–15)
BUN: 26 mg/dL — ABNORMAL HIGH (ref 8–23)
CO2: 28 mmol/L (ref 22–32)
Calcium: 9.5 mg/dL (ref 8.9–10.3)
Chloride: 104 mmol/L (ref 98–111)
Creatinine, Ser: 1.23 mg/dL — ABNORMAL HIGH (ref 0.44–1.00)
GFR, Estimated: 45 mL/min — ABNORMAL LOW (ref >60)
Glucose, Bld: 163 mg/dL — ABNORMAL HIGH (ref 70–99)
Potassium: 4.6 mmol/L (ref 3.5–5.1)
Sodium: 139 mmol/L (ref 135–145)

## 2021-09-23 LAB — BRAIN NATRIURETIC PEPTIDE: B Natriuretic Peptide: 30 pg/mL (ref 0.0–100.0)

## 2021-09-23 NOTE — Progress Notes (Signed)
Cardiology Office Note   Date:  09/23/2021   ID:  Ann Boone, DOB [birth year], MRN [id number]  PCP:  Alvina Filbert, MD  Cardiologist:   Dietrich Pates, MD   Pt presents for  f/u of diastolic CHF and HTN      History of Present Illness: Ann Boone is a 81 y.o. female with a history of edema, diastolic CHF and HTN    I last saw her in clinic back in June 2022   Had MRI of back in Jan     Some disc dz, DJD   Got injuection   No pain today     Had colonscopy in [place]    Diverticula     Pt denies  CP     Breathing OK    has asthma   No flare now    Some swelling in legs   L greater than R   OK in bed  Diddnt take BP meds today   Takes when gets hoe  Watches salt intake     Allergies:   Clindamycin/lincomycin   Past Medical History:  Diagnosis Date   Asthma    CHF (congestive heart failure) (HCC)    COPD (chronic obstructive pulmonary disease) (HCC) 05/18/2015   Diabetes mellitus without complication (HCC)    Hypertension    Mixed hyperlipidemia     Past Surgical History:  Procedure Laterality Date   CATARACT EXTRACTION     episiotomy repair       Social History:  The patient  reports that she quit smoking about 38 years ago. Her smoking use included cigarettes. She has never used smokeless tobacco. She reports that she does not drink alcohol and does not use drugs.   Family History:  The patient's family history includes Alcoholism in her brother; Cancer in her sister; Diabetes in her sister; Glaucoma in her brother and father; Heart disease in her father, maternal grandfather, and sister; Hypertension in her brother and mother; Kidney disease in her mother.    ROS:  Please see the history of present illness. All other systems are reviewed and  Negative to the above problem except as noted.    PHYSICAL EXAM: VS:  BP (!) 172/58    Pulse 64    Ht 5\' 5"  (1.651 m)    Wt 212 lb (96.2 kg)    SpO2 92%    BMI 35.28 kg/m   GEN: Morbidly obese 81 yo, in no acute  distress  HEENT: normal  Neck: JVP is normal   Cardiac: RRR; no murmurs     LE edema on L     Respiratory:  clear to auscultation bilaterally GI: soft, nontender, nondistended, + BS  No hepatomegaly  MS: no deformity Moving all extremities   Skin: warm and dry, no rash Neuro:  Grossly intact   Psych: Rel flat affect    EKG:  EKG is done today NSR 65 bpm    Lipid Panel    Component Value Date/Time   CHOL 112 11/01/2020 0948   TRIG 37 11/01/2020 0948   HDL 53 11/01/2020 0948   CHOLHDL 2.1 11/01/2020 0948   VLDL 7 11/01/2020 0948   LDLCALC 52 11/01/2020 0948   LDLCALC 63 09/29/2019 1022      Wt Readings from Last 3 Encounters:  09/23/21 212 lb (96.2 kg)  01/04/21 214 lb 12.8 oz (97.4 kg)  10/11/20 212 lb (96.2 kg)      ASSESSMENT AND PLAN: 1  BP  Did  not take meds today  Take amlodipine , coreg, benazepril for sure before heading out of house   Chlorathalidone when access to BR   FOllow      2 Chronic diastlic CHF  Fluid overall not too bad   Watch salt      3 Hx bradycardia HR is OK on current regine   4 HL  Keep on statin  Check labs    F/U in cilnic   end of November     Labs:  CBC, BMET, lipids and BNP, TSH     Signed, Dietrich Pates, MD  09/23/2021 10:08 AM    [facility] [address] [place], [place], [place]  [address] Phone: [phone]; Fax: [phone]                                                                                           ``````````````````

## 2021-09-23 NOTE — Patient Instructions (Signed)
Medication Instructions:  Your physician recommends that you continue on your current medications as directed. Please refer to the Current Medication list given to you today.  *If you need a refill on your cardiac medications before your next appointment, please call your pharmacy*   Lab Work: Your physician recommends that you return for lab work in: Today   If you have labs (blood work) drawn today and your tests are completely normal, you will receive your results only by: MyChart Message (if you have MyChart) OR A paper copy in the mail If you have any lab test that is abnormal or we need to change your treatment, we will call you to review the results.   Testing/Procedures: NONE    Follow-Up: At [facility], you and your health needs are our priority.  As part of our continuing mission to provide you with exceptional heart care, we have created designated Provider Care Teams.  These Care Teams include your primary Cardiologist (physician) and Advanced Practice Providers (APPs -  Physician Assistants and Nurse Practitioners) who all work together to provide you with the care you need, when you need it.  We recommend signing up for the patient portal called "MyChart".  Sign up information is provided on this After Visit Summary.  MyChart is used to connect with patients for Virtual Visits (Telemedicine).  Patients are able to view lab/test results, encounter notes, upcoming appointments, etc.  Non-urgent messages can be sent to your provider as well.   To learn more about what you can do with MyChart, go to [email or web address].    Your next appointment:    November   The format for your next appointment:   In Person  Provider:   Dietrich Pates, MD    Other Instructions Thank you for choosing [facility]!

## 2022-09-10 NOTE — Progress Notes (Unsigned)
Cardiology Office Note   Date:  09/11/2022   ID:  Ann Boone, DOB Nov 19, 1940, MRN [id number]  PCP:  Abran Richard, MD  Cardiologist:   Dorris Carnes, MD   Pt presents for  f/u of diastolic CHF and HTN      History of Present Illness: Ann Boone is a 82 y.o. female with a history of edema, diastolic CHF and HTN     I saw the pt in clinic in Feb 2023    Pt denies CP  Breathing is OK   Hs occasional LE edema   May be due to diet    Pt says she is lazy  needs to move more   PT took meds today   BP at home 130s/70s      Allergies:   Clindamycin/lincomycin   Past Medical History:  Diagnosis Date   Asthma    CHF (congestive heart failure) (HCC)    COPD (chronic obstructive pulmonary disease) ([place]) 05/18/2015   Diabetes mellitus without complication ([place])    Hypertension    Mixed hyperlipidemia     Past Surgical History:  Procedure Laterality Date   CATARACT EXTRACTION     episiotomy repair       Social History:  The patient  reports that she quit smoking about 39 years ago. Her smoking use included cigarettes. She has never used smokeless tobacco. She reports that she does not drink alcohol and does not use drugs.   Family History:  The patient's family history includes Alcoholism in her brother; Cancer in her sister; Diabetes in her sister; Glaucoma in her brother and father; Heart disease in her father, maternal grandfather, and sister; Hypertension in her brother and mother; Kidney disease in her mother.    ROS:  Please see the history of present illness. All other systems are reviewed and  Negative to the above problem except as noted.    PHYSICAL EXAM: VS:  BP (!) 150/70   Pulse 68   Ht 5' 5"$  (1.651 m)   Wt 210 lb (95.3 kg)   SpO2 94%   BMI 34.95 kg/m   GEN: Obese 82 yo, in no acute distress  HEENT: normal  Neck: JVP is not elevated  No bruit  Cardiac: RRR; no murmur  No LE edema   Respiratory:  clear to auscultation bilaterally GI: soft,  nontender, nondistended, + BS  No hepatomegaly  MS: no deformity Moving all extremities   Skin: warm and dry, no rash Neuro:  Grossly intact   Psych: Rel flat affect    EKG:  EKG shows SR 68 bpm   Low voltage  nonspecific ST changes   Lipid Panel    Component Value Date/Time   CHOL 125 09/23/2021 1050   TRIG 77 09/23/2021 1050   HDL 47 09/23/2021 1050   CHOLHDL 2.7 09/23/2021 1050   VLDL 15 09/23/2021 1050   LDLCALC 63 09/23/2021 1050   LDLCALC 63 09/29/2019 1022      Wt Readings from Last 3 Encounters:  09/11/22 210 lb (95.3 kg)  09/23/21 212 lb (96.2 kg)  01/04/21 214 lb 12.8 oz (97.4 kg)      ASSESSMENT AND PLAN: 1  BP BP is high today   She says better at home though still not optimal   Will check labs today   May go up on amlodipine or losartan Follow up in spring to reassess     2 Chronic diastlic CHF  Volume status overall OK  3 Hx bradycardia EKG OK   4 HL  Keep on statin   LDL 63  HDL 47  Trig 77    Check BMET , lipids   Follow up with her re meds    Follow up in clinic later this spring for BP    Signed, Dorris Carnes, MD  09/11/2022 11:04 AM    [facility] [place], [place], [place]  [address] Phone: [phone]; Fax: [phone]                                                                                           ``````````````````

## 2022-09-11 ENCOUNTER — Ambulatory Visit: Payer: 59 | Attending: Internal Medicine | Admitting: Internal Medicine

## 2022-09-11 ENCOUNTER — Encounter: Payer: Self-pay | Admitting: Internal Medicine

## 2022-09-11 ENCOUNTER — Other Ambulatory Visit (HOSPITAL_COMMUNITY)
Admission: RE | Admit: 2022-09-11 | Discharge: 2022-09-11 | Disposition: A | Discharge status: Home / Self Care | Payer: 59 | Source: Ambulatory Visit | Attending: Internal Medicine | Admitting: Internal Medicine

## 2022-09-11 VITALS — BP 150/70 | HR 68 | Ht 65.0 in | Wt 210.0 lb

## 2022-09-11 DIAGNOSIS — I10 Essential (primary) hypertension: Secondary | ICD-10-CM

## 2022-09-11 DIAGNOSIS — E785 Hyperlipidemia, unspecified: Secondary | ICD-10-CM

## 2022-09-11 DIAGNOSIS — Z131 Encounter for screening for diabetes mellitus: Secondary | ICD-10-CM

## 2022-09-11 DIAGNOSIS — Z79899 Other long term (current) drug therapy: Secondary | ICD-10-CM | POA: Diagnosis present

## 2022-09-11 LAB — BASIC METABOLIC PANEL
Anion gap: 10 (ref 5–15)
BUN: 31 mg/dL — ABNORMAL HIGH (ref 8–23)
CO2: 27 mmol/L (ref 22–32)
Calcium: 9.4 mg/dL (ref 8.9–10.3)
Chloride: 103 mmol/L (ref 98–111)
Creatinine, Ser: 1.32 mg/dL — ABNORMAL HIGH (ref 0.44–1.00)
GFR, Estimated: 41 mL/min — ABNORMAL LOW (ref >60)
Glucose, Bld: 151 mg/dL — ABNORMAL HIGH (ref 70–99)
Potassium: 4.5 mmol/L (ref 3.5–5.1)
Sodium: 140 mmol/L (ref 135–145)

## 2022-09-11 LAB — LIPID PANEL
Cholesterol: 135 mg/dL (ref 0–200)
HDL: 47 mg/dL (ref >40)
LDL Cholesterol: 73 mg/dL (ref 0–99)
Total CHOL/HDL Ratio: 2.9 RATIO
Triglycerides: 75 mg/dL (ref <150)
VLDL: 15 mg/dL (ref 0–40)

## 2022-09-11 LAB — CBC
HCT: 33.8 % — ABNORMAL LOW (ref 36.0–46.0)
Hemoglobin: 10.2 g/dL — ABNORMAL LOW (ref 12.0–15.0)
MCH: 24.9 pg — ABNORMAL LOW (ref 26.0–34.0)
MCHC: 30.2 g/dL (ref 30.0–36.0)
MCV: 82.6 fL (ref 80.0–100.0)
Platelets: 185 10*3/uL (ref 150–400)
RBC: 4.09 MIL/uL (ref 3.87–5.11)
RDW: 15 % (ref 11.5–15.5)
WBC: 5.4 10*3/uL (ref 4.0–10.5)
nRBC: 0 % (ref 0.0–0.2)

## 2022-09-11 LAB — HEMOGLOBIN A1C
Hgb A1c MFr Bld: 6.4 % — ABNORMAL HIGH (ref 4.8–5.6)
Mean Plasma Glucose: 136.98 mg/dL

## 2022-09-11 NOTE — Patient Instructions (Signed)
Medication Instructions:  Your physician recommends that you continue on your current medications as directed. Please refer to the Current Medication list given to you today.  *If you need a refill on your cardiac medications before your next appointment, please call your pharmacy*   Lab Work: Your physician recommends that you return for lab work in: Today   If you have labs (blood work) drawn today and your tests are completely normal, you will receive your results only by: MyChart Message (if you have MyChart) OR A paper copy in the mail If you have any lab test that is abnormal or we need to change your treatment, we will call you to review the results.   Testing/Procedures: NONE    Follow-Up: At [facility], you and your health needs are our priority.  As part of our continuing mission to provide you with exceptional heart care, we have created designated Provider Care Teams.  These Care Teams include your primary Cardiologist (physician) and Advanced Practice Providers (APPs -  Physician Assistants and Nurse Practitioners) who all work together to provide you with the care you need, when you need it.  We recommend signing up for the patient portal called "MyChart".  Sign up information is provided on this After Visit Summary.  MyChart is used to connect with patients for Virtual Visits (Telemedicine).  Patients are able to view lab/test results, encounter notes, upcoming appointments, etc.  Non-urgent messages can be sent to your provider as well.   To learn more about what you can do with MyChart, go to [email or web address].    Your next appointment:    May   Provider:   Dorris Carnes, MD    Other Instructions Thank you for choosing [facility]!

## 2022-12-04 ENCOUNTER — Encounter: Payer: Self-pay | Admitting: Internal Medicine

## 2022-12-04 ENCOUNTER — Encounter: Payer: Self-pay | Admitting: *Deleted

## 2022-12-04 ENCOUNTER — Ambulatory Visit: Payer: 59 | Attending: Internal Medicine | Admitting: Internal Medicine

## 2022-12-04 VITALS — BP 128/58 | HR 64 | Ht 65.0 in | Wt 213.0 lb

## 2022-12-04 DIAGNOSIS — R0602 Shortness of breath: Secondary | ICD-10-CM | POA: Diagnosis not present

## 2022-12-04 NOTE — Patient Instructions (Signed)
Medication Instructions:  Your physician recommends that you continue on your current medications as directed. Please refer to the Current Medication list given to you today.  *If you need a refill on your cardiac medications before your next appointment, please call your pharmacy*   Lab Work: NONE   If you have labs (blood work) drawn today and your tests are completely normal, you will receive your results only by: MyChart Message (if you have MyChart) OR A paper copy in the mail If you have any lab test that is abnormal or we need to change your treatment, we will call you to review the results.   Testing/Procedures: Your physician has requested that you have an echocardiogram. Echocardiography is a painless test that uses sound waves to create images of your heart. It provides your doctor with information about the size and shape of your heart and how well your heart's chambers and valves are working. This procedure takes approximately one hour. There are no restrictions for this procedure. Please do NOT wear cologne, perfume, aftershave, or lotions (deodorant is allowed). Please arrive 15 minutes prior to your appointment time.  Your physician has requested that you have a lexiscan myoview. For further information please visit [email or web address] Please follow instruction sheet, as given.  Your physician has recommended that you have a pulmonary function test. Pulmonary Function Tests are a group of tests that measure how well air moves in and out of your lungs.    Follow-Up: At [facility], you and your health needs are our priority.  As part of our continuing mission to provide you with exceptional heart care, we have created designated Provider Care Teams.  These Care Teams include your primary Cardiologist (physician) and Advanced Practice Providers (APPs -  Physician Assistants and Nurse Practitioners) who all work together to provide you with the care you need, when  you need it.  We recommend signing up for the patient portal called "MyChart".  Sign up information is provided on this After Visit Summary.  MyChart is used to connect with patients for Virtual Visits (Telemedicine).  Patients are able to view lab/test results, encounter notes, upcoming appointments, etc.  Non-urgent messages can be sent to your provider as well.   To learn more about what you can do with MyChart, go to [email or web address].    Your next appointment:    December   Provider:   You may see Dietrich Pates, MD or one of the following Advanced Practice Providers on your designated Care Team:   Randall An, PA-C  Jacolyn Reedy, PA-C     Other Instructions Thank you for choosing  [facility]!

## 2022-12-04 NOTE — Progress Notes (Signed)
Cardiology Office Note   Date:  12/04/2022   ID:  Ann Boone, DOB 27-Sep-1940, MRN [id number]  PCP:  Alvina Filbert, MD  Cardiologist:   Dietrich Pates, MD   Pt presents for  f/u of HTN and HFpEF    History of Present Illness: Ann Boone is a 82 y.o. female with a hx of LE edema, HFpEF and HTN   I  saw the pt in clinic in Feb 2024  The pt denies CP   Does say that she gets SOB with activity like walking    Denies dizziness  Allergies:   Clindamycin/lincomycin   Past Medical History:  Diagnosis Date   Asthma    CHF (congestive heart failure) (HCC)    COPD (chronic obstructive pulmonary disease) (HCC) 05/18/2015   Diabetes mellitus without complication (HCC)    Hypertension    Mixed hyperlipidemia     Past Surgical History:  Procedure Laterality Date   CATARACT EXTRACTION     episiotomy repair       Social History:  The patient  reports that she quit smoking about 39 years ago. Her smoking use included cigarettes. She has never used smokeless tobacco. She reports that she does not drink alcohol and does not use drugs.   Family History:  The patient's family history includes Alcoholism in her brother; Cancer in her sister; Diabetes in her sister; Glaucoma in her brother and father; Heart disease in her father, maternal grandfather, and sister; Hypertension in her brother and mother; Kidney disease in her mother.    ROS:  Please see the history of present illness. All other systems are reviewed and  Negative to the above problem except as noted.    PHYSICAL EXAM: VS:  BP (!) 128/58   Pulse 64   Ht 5\' 5"  (1.651 m)   Wt 213 lb (96.6 kg)   SpO2 97%   BMI 35.45 kg/m   GEN: Obese 82 yo, in NAD Neck: JVP is normal No bruit  Cardiac: RRR; no murmur  No LE edema   Respiratory:  clear to auscultation  GI: soft, nontender   No hepatomegaly  Ext are without edema  EKG:  EKG shows SR 84 bpm  Lipid Panel    Component Value Date/Time   CHOL 135 09/11/2022 1137    TRIG 75 09/11/2022 1137   HDL 47 09/11/2022 1137   CHOLHDL 2.9 09/11/2022 1137   VLDL 15 09/11/2022 1137   LDLCALC 73 09/11/2022 1137   LDLCALC 63 09/29/2019 1022      Wt Readings from Last 3 Encounters:  12/04/22 213 lb (96.6 kg)  09/11/22 210 lb (95.3 kg)  09/23/21 212 lb (96.2 kg)      ASSESSMENT AND PLAN:  1  Dyspnea with exertion.  Concerning  as pt appears more symptomatic.   Volume status appears OK   Will check Echo.   Set up for Lexiscan myoview   Set up for PFTss  2   HTN   BP is OK        3 Hx of HFpEF   Repeat echo ordered to reassess LVEF given dyspnea  4 HL  Keep on statin   LDL 73  HDL 47    Check BMET , lipids   Follow up with her re meds    Follow up in clinic later this spring for BP    Signed, Dietrich Pates, MD  12/04/2022 9:37 AM    [facility] [address]  [address], [place], [place]  [address] Phone: [phone]; Fax: [phone]                                                                                           ``````````````````

## 2022-12-12 ENCOUNTER — Ambulatory Visit (HOSPITAL_COMMUNITY)
Admission: RE | Admit: 2022-12-12 | Discharge: 2022-12-12 | Disposition: A | Discharge status: Home / Self Care | Payer: 59 | Source: Ambulatory Visit | Attending: Internal Medicine | Admitting: Internal Medicine

## 2022-12-12 ENCOUNTER — Encounter (HOSPITAL_COMMUNITY)
Admission: RE | Admit: 2022-12-12 | Discharge: 2022-12-12 | Disposition: A | Discharge status: Home / Self Care | Payer: Medicare HMO | Source: Ambulatory Visit | Attending: Internal Medicine | Admitting: Internal Medicine

## 2022-12-12 DIAGNOSIS — R0602 Shortness of breath: Secondary | ICD-10-CM | POA: Insufficient documentation

## 2022-12-12 LAB — NM MYOCAR MULTI W/SPECT W/WALL MOTION / EF
Base ST Depression (mm): 0 mm
LV dias vol: 61 mL (ref 46–106)
LV sys vol: 28 mL
Nuc Stress EF: 55 %
Peak HR: 84 {beats}/min
RATE: 0.8
Rest HR: 68 {beats}/min
Rest Nuclear Isotope Dose: 11 mCi
SDS: 2
SRS: 0
SSS: 2
ST Depression (mm): 0 mm
Stress Nuclear Isotope Dose: 30 mCi
TID: 1.81

## 2022-12-12 MED ORDER — REGADENOSON 0.4 MG/5ML IV SOLN
INTRAVENOUS | Status: AC
  Administered 2022-12-12: 0.4 mg via INTRAVENOUS
  Filled 2022-12-12: qty 5

## 2022-12-12 MED ORDER — TECHNETIUM TC 99M TETROFOSMIN IV KIT
30.0000 | PACK | Freq: Once | INTRAVENOUS | Status: AC | PRN
  Administered 2022-12-12: 30 via INTRAVENOUS

## 2022-12-12 MED ORDER — TECHNETIUM TC 99M TETROFOSMIN IV KIT
10.0000 | PACK | Freq: Once | INTRAVENOUS | Status: AC | PRN
  Administered 2022-12-12: 11 via INTRAVENOUS

## 2022-12-12 MED ORDER — SODIUM CHLORIDE FLUSH 0.9 % IV SOLN
INTRAVENOUS | Status: AC
  Administered 2022-12-12: 10 mL via INTRAVENOUS
  Filled 2022-12-12: qty 10

## 2022-12-27 ENCOUNTER — Other Ambulatory Visit (HOSPITAL_COMMUNITY): Payer: Self-pay | Admitting: Internal Medicine

## 2022-12-27 DIAGNOSIS — Z1231 Encounter for screening mammogram for malignant neoplasm of breast: Secondary | ICD-10-CM

## 2023-01-04 ENCOUNTER — Ambulatory Visit (HOSPITAL_COMMUNITY)
Admission: RE | Admit: 2023-01-04 | Discharge: 2023-01-04 | Disposition: A | Discharge status: Home / Self Care | Payer: Medicare HMO | Source: Ambulatory Visit | Attending: Internal Medicine | Admitting: Internal Medicine

## 2023-01-04 DIAGNOSIS — R0602 Shortness of breath: Secondary | ICD-10-CM | POA: Diagnosis present

## 2023-01-04 LAB — ECHOCARDIOGRAM COMPLETE
Area-P 1/2: 2.63 cm2
S' Lateral: 2.7 cm

## 2023-01-04 NOTE — Progress Notes (Signed)
  Echocardiogram 2D Echocardiogram has been performed.  Ann Boone 01/04/2023, 9:10 AM

## 2023-01-17 ENCOUNTER — Other Ambulatory Visit: Payer: Self-pay | Admitting: *Deleted

## 2023-01-17 ENCOUNTER — Encounter: Payer: Self-pay | Admitting: *Deleted

## 2023-01-17 DIAGNOSIS — I2510 Atherosclerotic heart disease of native coronary artery without angina pectoris: Secondary | ICD-10-CM

## 2023-02-06 ENCOUNTER — Telehealth (HOSPITAL_COMMUNITY): Payer: Self-pay | Admitting: *Deleted

## 2023-02-06 NOTE — Telephone Encounter (Signed)
Attempted to call patient regarding upcoming cardiac CT appointment. ?Unable to leave a voicemail. ? ?Chiana Wamser RN Navigator Cardiac Imaging ?[facility] ?[phone] Office ?[phone] Cell ? ?

## 2023-02-07 ENCOUNTER — Encounter (HOSPITAL_COMMUNITY): Payer: Self-pay

## 2023-02-07 ENCOUNTER — Ambulatory Visit (HOSPITAL_COMMUNITY)
Admission: RE | Admit: 2023-02-07 | Discharge: 2023-02-07 | Disposition: A | Discharge status: Home / Self Care | Payer: 59 | Source: Ambulatory Visit | Attending: Internal Medicine | Admitting: Internal Medicine

## 2023-02-07 DIAGNOSIS — I2510 Atherosclerotic heart disease of native coronary artery without angina pectoris: Secondary | ICD-10-CM | POA: Diagnosis present

## 2023-02-07 LAB — POCT I-STAT CREATININE: Creatinine, Ser: 1.7 mg/dL — ABNORMAL HIGH (ref 0.44–1.00)

## 2023-02-07 MED ORDER — NITROGLYCERIN 0.4 MG SL SUBL: 0.8000 mg | SUBLINGUAL_TABLET | Freq: Once | SUBLINGUAL | Status: DC

## 2023-02-13 ENCOUNTER — Telehealth: Payer: Self-pay

## 2023-02-13 DIAGNOSIS — Z79899 Other long term (current) drug therapy: Secondary | ICD-10-CM

## 2023-02-13 MED ORDER — CHLORTHALIDONE 25 MG PO TABS: 12.5000 mg | ORAL_TABLET | Freq: Every day | ORAL | 3 refills | Status: DC

## 2023-02-13 NOTE — Telephone Encounter (Signed)
-----   Message from Nurse Dewayne Hatch B sent at 02/13/2023  9:14 AM EDT -----  ----- Message ----- From: Pricilla Riffle, MD Sent: 02/11/2023   3:04 PM EDT To: Bertram Millard, RN  Pre CT BMET:  Cr 1.7    I would reocmm backing off on chlorthalidone to 12.5    Follow up BMET in 10 days

## 2023-02-13 NOTE — Telephone Encounter (Signed)
I spoke with patient and she will decrease chlorthalidone to 12.5 mg every day and repeat bmet in 10 days at [facility]  I spoke with [facility] and they will call patient to have her return her pill pack for adjustment.

## 2023-02-20 ENCOUNTER — Ambulatory Visit (HOSPITAL_COMMUNITY)
Admission: RE | Admit: 2023-02-20 | Discharge: 2023-02-20 | Disposition: A | Discharge status: Home / Self Care | Payer: 59 | Source: Ambulatory Visit | Attending: Internal Medicine | Admitting: Internal Medicine

## 2023-02-23 ENCOUNTER — Other Ambulatory Visit (HOSPITAL_COMMUNITY)
Admission: RE | Admit: 2023-02-23 | Discharge: 2023-02-23 | Disposition: A | Discharge status: Home / Self Care | Payer: 59 | Source: Ambulatory Visit | Attending: Internal Medicine | Admitting: Internal Medicine

## 2023-02-23 ENCOUNTER — Ambulatory Visit: Payer: 59

## 2023-02-23 DIAGNOSIS — Z79899 Other long term (current) drug therapy: Secondary | ICD-10-CM | POA: Diagnosis present

## 2023-02-23 LAB — BASIC METABOLIC PANEL
Anion gap: 9 (ref 5–15)
BUN: 37 mg/dL — ABNORMAL HIGH (ref 8–23)
CO2: 25 mmol/L (ref 22–32)
Calcium: 9.2 mg/dL (ref 8.9–10.3)
Chloride: 103 mmol/L (ref 98–111)
Creatinine, Ser: 1.77 mg/dL — ABNORMAL HIGH (ref 0.44–1.00)
GFR, Estimated: 29 mL/min — ABNORMAL LOW (ref >60)
Glucose, Bld: 153 mg/dL — ABNORMAL HIGH (ref 70–99)
Potassium: 5.2 mmol/L — ABNORMAL HIGH (ref 3.5–5.1)
Sodium: 137 mmol/L (ref 135–145)

## 2023-03-02 ENCOUNTER — Telehealth: Payer: Self-pay

## 2023-03-02 DIAGNOSIS — Z79899 Other long term (current) drug therapy: Secondary | ICD-10-CM

## 2023-03-02 NOTE — Telephone Encounter (Signed)
Patient will hold her Benazapril from her pill-pack, I will call [facility] and spoke with pharmacist. They tell me that [facility] had increased her Benazapril to 40 mg every day. I will FYI Dr.Ross.     Patient will have repeat BMET on Wednesday, 03/07/23 at [facility]  She will monitor BP at home and let us know if is trending up

## 2023-03-02 NOTE — Telephone Encounter (Signed)
-----   Message from Nurse Rudene Anda sent at 03/02/2023  3:49 PM EDT -----  ----- Message ----- From: Pricilla Riffle, MD Sent: 03/02/2023   3:05 PM EDT To: Bertram Millard, RN  Cr still up  K is 5.2 Hold ACE inhibitor   Keep on other meds  Follow BMET next week  WEdnsday   Follow BP at home

## 2023-03-07 ENCOUNTER — Other Ambulatory Visit (HOSPITAL_COMMUNITY)
Admission: RE | Admit: 2023-03-07 | Discharge: 2023-03-07 | Disposition: A | Discharge status: Home / Self Care | Payer: 59 | Source: Ambulatory Visit | Attending: Internal Medicine | Admitting: Internal Medicine

## 2023-03-07 DIAGNOSIS — Z79899 Other long term (current) drug therapy: Secondary | ICD-10-CM | POA: Insufficient documentation

## 2023-03-07 LAB — BASIC METABOLIC PANEL
Anion gap: 11 (ref 5–15)
BUN: 23 mg/dL (ref 8–23)
CO2: 26 mmol/L (ref 22–32)
Calcium: 9.3 mg/dL (ref 8.9–10.3)
Chloride: 99 mmol/L (ref 98–111)
Creatinine, Ser: 1.26 mg/dL — ABNORMAL HIGH (ref 0.44–1.00)
GFR, Estimated: 43 mL/min — ABNORMAL LOW (ref >60)
Glucose, Bld: 140 mg/dL — ABNORMAL HIGH (ref 70–99)
Potassium: 3.8 mmol/L (ref 3.5–5.1)
Sodium: 136 mmol/L (ref 135–145)

## 2023-03-26 ENCOUNTER — Ambulatory Visit (HOSPITAL_COMMUNITY)
Admission: RE | Admit: 2023-03-26 | Discharge: 2023-03-26 | Disposition: A | Discharge status: Home / Self Care | Payer: 59 | Source: Ambulatory Visit | Attending: Internal Medicine | Admitting: Internal Medicine

## 2023-03-26 DIAGNOSIS — Z1231 Encounter for screening mammogram for malignant neoplasm of breast: Secondary | ICD-10-CM | POA: Insufficient documentation

## 2023-03-27 ENCOUNTER — Inpatient Hospital Stay
Admission: RE | Admit: 2023-03-27 | Discharge: 2023-03-27 | Disposition: A | Discharge status: Home / Self Care | Payer: Self-pay | Source: Ambulatory Visit | Attending: Internal Medicine | Admitting: Internal Medicine

## 2023-03-27 ENCOUNTER — Other Ambulatory Visit (HOSPITAL_COMMUNITY): Payer: Self-pay | Admitting: Internal Medicine

## 2023-03-27 DIAGNOSIS — Z1231 Encounter for screening mammogram for malignant neoplasm of breast: Secondary | ICD-10-CM

## 2023-03-28 ENCOUNTER — Encounter (HOSPITAL_COMMUNITY): Payer: Self-pay | Admitting: Physician Assistant

## 2023-09-04 ENCOUNTER — Ambulatory Visit: Payer: 59 | Admitting: Student

## 2023-10-08 NOTE — Progress Notes (Signed)
 Cardiology Office Note:  .   Date:  10/16/2023  ID:  Ann Boone, DOB Nov 16, 1940, MRN [id number] PCP: Alvina Filbert, MD  [facility] Providers Cardiologist:  Dietrich Pates, MD    History of Present Illness: Ann Boone is a 83 y.o. female with history of HFpEF, HTN, LE edema, HLD   Patient saw Dr. Tenny Craw 12/04/22 with DOE and ordered echo, lexi PFT's. Echo normal LVEF 60-65% grade 1 DD, lexi normal low risk but ? Transient ischemic dilation. Dr. Tenny Craw asked for a reprocess of it. CT ordered but not done due to kidneys.Benazapril held.  Patient says she's doing the same. No chest pain but DOE. No regular exercise other than housework. She does have asthma and hasn't had an inhaler in a long time. No recent wheezing. Has appt with kidney specialist tomorrow.  ROS:    Studies Reviewed: Marland Kitchen         Prior CV Studies:     Echo 01/04/24 IMPRESSIONS     1. Left ventricular ejection fraction, by estimation, is 60 to 65%. The  left ventricle has normal function. The left ventricle has no regional  wall motion abnormalities. Left ventricular diastolic parameters are  consistent with Grade I diastolic  dysfunction (impaired relaxation).   2. Right ventricular systolic function is normal. The right ventricular  size is normal. There is normal pulmonary artery systolic pressure.   3. The mitral valve is normal in structure. No evidence of mitral valve  regurgitation. No evidence of mitral stenosis.   4. The aortic valve is tricuspid. Aortic valve regurgitation is not  visualized. No aortic stenosis is present.   5. The inferior vena cava is normal in size with <50% respiratory  variability, suggesting right atrial pressure of 8 mmHg.   NST 11/2022    Stress ECG is negative for ischemia and arrythmias.   LV perfusion is normal. There is no evidence of ischemia. There is no evidence of infarction.   Left ventricular function is normal. Nuclear stress EF: 55 %.   Evidence of  transient ischemic dilation (TID) noted, 1.81. TID was appreciated quantitatively but not visually.   The study is normal. The study is low risk.    Risk Assessment/Calculations:             Physical Exam:   VS:  BP 130/64 (BP Location: Right Arm, Patient Position: Sitting, Cuff Size: Large)   Pulse 70   Ht 5\' 4"  (1.626 m)   Wt 90 kg   SpO2 96%   BMI 34.06 kg/m    Wt Readings from Last 3 Encounters:  10/16/23 90 kg  12/04/22 96.6 kg  09/11/22 95.3 kg    GEN: Obese in no acute distress NECK: No JVD; No carotid bruits CARDIAC:  RRR, no murmurs, rubs, gallops RESPIRATORY:  Clear to auscultation without rales, wheezing or rhonchi  ABDOMEN: Soft, non-tender, non-distended EXTREMITIES:  No edema; No deformity   ASSESSMENT AND PLAN: .    HFpEF/LE edema-compensated today with no CHF on exam  DOE chronic and unchanged. She wonders if it's from asthma-doesn't have inhalers. Echo 12/2022 LVEF 60-65% G1D. Lexi ? Transient ishcmic dilation-Dr. Tenny Craw ordered CT but not done due to CKD. No chest pain. Sees renal tomorrow. Will hold off on ordering again until seen by renal. Symptoms unchanged.  HTN well controlled  HLD-on mevacor. Had labs yest by PCP. Will try to get results.   CKD-has appt. With renal tomorrow  Dispo: f/u in 1 yr.  Signed, Jacolyn Reedy, PA-C

## 2023-10-16 ENCOUNTER — Ambulatory Visit: Payer: 59 | Attending: Physician Assistant | Admitting: Physician Assistant

## 2023-10-16 ENCOUNTER — Encounter: Payer: Self-pay | Admitting: Physician Assistant

## 2023-10-16 VITALS — BP 130/64 | HR 70 | Ht 64.0 in | Wt 198.4 lb

## 2023-10-16 DIAGNOSIS — N183 Chronic kidney disease, stage 3 unspecified: Secondary | ICD-10-CM

## 2023-10-16 DIAGNOSIS — E785 Hyperlipidemia, unspecified: Secondary | ICD-10-CM

## 2023-10-16 DIAGNOSIS — I10 Essential (primary) hypertension: Secondary | ICD-10-CM

## 2023-10-16 DIAGNOSIS — I5032 Chronic diastolic (congestive) heart failure: Secondary | ICD-10-CM

## 2023-10-16 DIAGNOSIS — I2510 Atherosclerotic heart disease of native coronary artery without angina pectoris: Secondary | ICD-10-CM

## 2023-10-16 NOTE — Patient Instructions (Signed)
 Medication Instructions:  Your physician recommends that you continue on your current medications as directed. Please refer to the Current Medication list given to you today.  *If you need a refill on your cardiac medications before your next appointment, please call your pharmacy*   Lab Work: NONE   If you have labs (blood work) drawn today and your tests are completely normal, you will receive your results only by: MyChart Message (if you have MyChart) OR A paper copy in the mail If you have any lab test that is abnormal or we need to change your treatment, we will call you to review the results.   Testing/Procedures: NONE     Follow-Up: At [facility], you and your health needs are our priority.  As part of our continuing mission to provide you with exceptional heart care, we have created designated Provider Care Teams.  These Care Teams include your primary Cardiologist (physician) and Advanced Practice Providers (APPs -  Physician Assistants and Nurse Practitioners) who all work together to provide you with the care you need, when you need it.  We recommend signing up for the patient portal called "MyChart".  Sign up information is provided on this After Visit Summary.  MyChart is used to connect with patients for Virtual Visits (Telemedicine).  Patients are able to view lab/test results, encounter notes, upcoming appointments, etc.  Non-urgent messages can be sent to your provider as well.   To learn more about what you can do with MyChart, go to [email or web address].    Your next appointment:   1 year(s)  Provider:   You may see Dietrich Pates, MD or one of the following Advanced Practice Providers on your designated Care Team:   Randall An, PA-C  Jacolyn Reedy, PA-C     Other Instructions Thank you for choosing [facility]!

## 2023-10-18 ENCOUNTER — Encounter: Payer: Self-pay | Admitting: Internal Medicine

## 2023-12-14 ENCOUNTER — Other Ambulatory Visit: Payer: Self-pay | Admitting: Internal Medicine

## 2024-05-14 ENCOUNTER — Other Ambulatory Visit (HOSPITAL_COMMUNITY): Payer: Self-pay | Admitting: Internal Medicine

## 2024-05-14 DIAGNOSIS — Z1231 Encounter for screening mammogram for malignant neoplasm of breast: Secondary | ICD-10-CM

## 2024-05-21 ENCOUNTER — Encounter (HOSPITAL_COMMUNITY): Payer: Self-pay

## 2024-05-21 ENCOUNTER — Ambulatory Visit (HOSPITAL_COMMUNITY)
Admission: RE | Admit: 2024-05-21 | Discharge: 2024-05-21 | Disposition: A | Discharge status: Home / Self Care | Source: Ambulatory Visit | Attending: Internal Medicine | Admitting: Internal Medicine

## 2024-05-21 DIAGNOSIS — Z1231 Encounter for screening mammogram for malignant neoplasm of breast: Secondary | ICD-10-CM | POA: Diagnosis present

## 2024-10-10 ENCOUNTER — Ambulatory Visit: Admitting: Internal Medicine
